# Patient Record
Sex: Female | Born: 1960 | Race: White | Hispanic: No | Marital: Single | Smoking: Current every day smoker
Health system: Southern US, Community
[De-identification: ages and names within clinical notes are randomized; demographics above are authoritative.]

## PROBLEM LIST (undated history)

## (undated) DIAGNOSIS — F319 Bipolar disorder, unspecified: Secondary | ICD-10-CM

## (undated) DIAGNOSIS — M549 Dorsalgia, unspecified: Secondary | ICD-10-CM

## (undated) DIAGNOSIS — I10 Essential (primary) hypertension: Secondary | ICD-10-CM

## (undated) DIAGNOSIS — G8929 Other chronic pain: Secondary | ICD-10-CM

## (undated) DIAGNOSIS — M419 Scoliosis, unspecified: Secondary | ICD-10-CM

## (undated) DIAGNOSIS — M1990 Unspecified osteoarthritis, unspecified site: Secondary | ICD-10-CM

## (undated) DIAGNOSIS — F32A Depression, unspecified: Secondary | ICD-10-CM

## (undated) DIAGNOSIS — R12 Heartburn: Secondary | ICD-10-CM

## (undated) DIAGNOSIS — F410 Panic disorder [episodic paroxysmal anxiety] without agoraphobia: Secondary | ICD-10-CM

## (undated) DIAGNOSIS — F419 Anxiety disorder, unspecified: Secondary | ICD-10-CM

## (undated) DIAGNOSIS — F329 Major depressive disorder, single episode, unspecified: Secondary | ICD-10-CM

## (undated) HISTORY — PX: APPENDECTOMY: SHX54

## (undated) HISTORY — PX: BACK SURGERY: SHX140

---

## 2007-03-01 ENCOUNTER — Inpatient Hospital Stay: Payer: Self-pay | Admitting: Unknown Physician Specialty

## 2009-04-21 ENCOUNTER — Inpatient Hospital Stay: Payer: Self-pay | Admitting: Psychiatry

## 2011-09-09 ENCOUNTER — Emergency Department (HOSPITAL_COMMUNITY)
Admission: EM | Admit: 2011-09-09 | Discharge: 2011-09-09 | Disposition: A | Discharge status: Home / Self Care | Payer: Medicaid Other | Attending: Emergency Medicine | Admitting: Emergency Medicine

## 2011-09-09 ENCOUNTER — Encounter (HOSPITAL_COMMUNITY): Payer: Self-pay

## 2011-09-09 DIAGNOSIS — M1990 Unspecified osteoarthritis, unspecified site: Secondary | ICD-10-CM | POA: Insufficient documentation

## 2011-09-09 DIAGNOSIS — M545 Low back pain, unspecified: Secondary | ICD-10-CM | POA: Insufficient documentation

## 2011-09-09 DIAGNOSIS — I10 Essential (primary) hypertension: Secondary | ICD-10-CM | POA: Insufficient documentation

## 2011-09-09 DIAGNOSIS — F17200 Nicotine dependence, unspecified, uncomplicated: Secondary | ICD-10-CM | POA: Insufficient documentation

## 2011-09-09 DIAGNOSIS — F319 Bipolar disorder, unspecified: Secondary | ICD-10-CM | POA: Insufficient documentation

## 2011-09-09 DIAGNOSIS — M4120 Other idiopathic scoliosis, site unspecified: Secondary | ICD-10-CM | POA: Insufficient documentation

## 2011-09-09 DIAGNOSIS — G8929 Other chronic pain: Secondary | ICD-10-CM

## 2011-09-09 HISTORY — DX: Scoliosis, unspecified: M41.9

## 2011-09-09 HISTORY — DX: Essential (primary) hypertension: I10

## 2011-09-09 HISTORY — DX: Anxiety disorder, unspecified: F41.9

## 2011-09-09 HISTORY — DX: Depression, unspecified: F32.A

## 2011-09-09 HISTORY — DX: Panic disorder (episodic paroxysmal anxiety): F41.0

## 2011-09-09 HISTORY — DX: Bipolar disorder, unspecified: F31.9

## 2011-09-09 HISTORY — DX: Unspecified osteoarthritis, unspecified site: M19.90

## 2011-09-09 HISTORY — DX: Heartburn: R12

## 2011-09-09 HISTORY — DX: Major depressive disorder, single episode, unspecified: F32.9

## 2011-09-09 MED ORDER — ONDANSETRON 4 MG PO TBDP
4.0000 mg | ORAL_TABLET | Freq: Once | ORAL | Status: AC
  Administered 2011-09-09: 4 mg via ORAL

## 2011-09-09 MED ORDER — ONDANSETRON 4 MG PO TBDP
ORAL_TABLET | ORAL | Status: AC
  Administered 2011-09-09: 4 mg via ORAL
  Filled 2011-09-09: qty 1

## 2011-09-09 MED ORDER — OXYCODONE-ACETAMINOPHEN 7.5-325 MG PO TABS: 1.0000 | ORAL_TABLET | ORAL | Status: AC | PRN

## 2011-09-09 MED ORDER — HYDROMORPHONE HCL 2 MG PO TABS: 4.0000 mg | ORAL_TABLET | Freq: Once | ORAL | Status: DC

## 2011-09-09 MED ORDER — HYDROMORPHONE HCL PF 1 MG/ML IJ SOLN
1.0000 mg | Freq: Once | INTRAMUSCULAR | Status: AC
  Administered 2011-09-09: 1 mg via INTRAMUSCULAR
  Filled 2011-09-09: qty 1
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
## 2011-09-09 NOTE — ED Provider Notes (Signed)
Medical screening examination/treatment/procedure(s) were performed by non-physician practitioner and as supervising physician I was immediately available for consultation/collaboration.   Renae Mottley L Kordell Jafri, MD 09/09/11 1515 
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
## 2011-09-09 NOTE — ED Notes (Signed)
Patient with no complaints at this time. Respirations even and unlabored. Skin warm/dry. Discharge instructions reviewed with patient at this time. Patient given opportunity to voice concerns/ask questions. Patient discharged at this time and left Emergency Department with steady gait.   
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
## 2011-09-26 ENCOUNTER — Encounter (HOSPITAL_COMMUNITY): Payer: Self-pay | Admitting: Emergency Medicine

## 2011-09-26 ENCOUNTER — Emergency Department (HOSPITAL_COMMUNITY)
Admission: EM | Admit: 2011-09-26 | Discharge: 2011-09-26 | Disposition: A | Discharge status: Home / Self Care | Payer: Medicaid Other | Attending: Emergency Medicine | Admitting: Emergency Medicine

## 2011-09-26 DIAGNOSIS — F319 Bipolar disorder, unspecified: Secondary | ICD-10-CM | POA: Insufficient documentation

## 2011-09-26 DIAGNOSIS — M4120 Other idiopathic scoliosis, site unspecified: Secondary | ICD-10-CM | POA: Insufficient documentation

## 2011-09-26 DIAGNOSIS — M1990 Unspecified osteoarthritis, unspecified site: Secondary | ICD-10-CM | POA: Insufficient documentation

## 2011-09-26 DIAGNOSIS — I10 Essential (primary) hypertension: Secondary | ICD-10-CM | POA: Insufficient documentation

## 2011-09-26 DIAGNOSIS — G8929 Other chronic pain: Secondary | ICD-10-CM

## 2011-09-26 DIAGNOSIS — Z760 Encounter for issue of repeat prescription: Secondary | ICD-10-CM | POA: Insufficient documentation

## 2011-09-26 DIAGNOSIS — F17200 Nicotine dependence, unspecified, uncomplicated: Secondary | ICD-10-CM | POA: Insufficient documentation

## 2011-09-26 NOTE — ED Notes (Signed)
Pt walked out after EDP eval cursing. Left prior to letting nurse know she was leaving. EDP aware.
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
## 2012-06-05 ENCOUNTER — Emergency Department (HOSPITAL_COMMUNITY)
Admission: EM | Admit: 2012-06-05 | Discharge: 2012-06-05 | Disposition: A | Discharge status: Home / Self Care | Payer: Medicaid Other | Attending: Emergency Medicine | Admitting: Emergency Medicine

## 2012-06-05 ENCOUNTER — Encounter (HOSPITAL_COMMUNITY): Payer: Self-pay

## 2012-06-05 DIAGNOSIS — Z9889 Other specified postprocedural states: Secondary | ICD-10-CM | POA: Insufficient documentation

## 2012-06-05 DIAGNOSIS — X503XXA Overexertion from repetitive movements, initial encounter: Secondary | ICD-10-CM | POA: Insufficient documentation

## 2012-06-05 DIAGNOSIS — IMO0002 Reserved for concepts with insufficient information to code with codable children: Secondary | ICD-10-CM | POA: Insufficient documentation

## 2012-06-05 DIAGNOSIS — Y9389 Activity, other specified: Secondary | ICD-10-CM | POA: Insufficient documentation

## 2012-06-05 DIAGNOSIS — I10 Essential (primary) hypertension: Secondary | ICD-10-CM | POA: Insufficient documentation

## 2012-06-05 DIAGNOSIS — Y9289 Other specified places as the place of occurrence of the external cause: Secondary | ICD-10-CM | POA: Insufficient documentation

## 2012-06-05 DIAGNOSIS — F319 Bipolar disorder, unspecified: Secondary | ICD-10-CM | POA: Insufficient documentation

## 2012-06-05 DIAGNOSIS — Z79899 Other long term (current) drug therapy: Secondary | ICD-10-CM | POA: Insufficient documentation

## 2012-06-05 DIAGNOSIS — F410 Panic disorder [episodic paroxysmal anxiety] without agoraphobia: Secondary | ICD-10-CM | POA: Insufficient documentation

## 2012-06-05 DIAGNOSIS — Z8719 Personal history of other diseases of the digestive system: Secondary | ICD-10-CM | POA: Insufficient documentation

## 2012-06-05 DIAGNOSIS — Z760 Encounter for issue of repeat prescription: Secondary | ICD-10-CM | POA: Insufficient documentation

## 2012-06-05 DIAGNOSIS — F17200 Nicotine dependence, unspecified, uncomplicated: Secondary | ICD-10-CM | POA: Insufficient documentation

## 2012-06-05 DIAGNOSIS — Z8739 Personal history of other diseases of the musculoskeletal system and connective tissue: Secondary | ICD-10-CM | POA: Insufficient documentation

## 2012-06-05 DIAGNOSIS — M549 Dorsalgia, unspecified: Secondary | ICD-10-CM

## 2012-06-05 MED ORDER — CYCLOBENZAPRINE HCL 10 MG PO TABS: 10.0000 mg | ORAL_TABLET | Freq: Three times a day (TID) | ORAL | Status: DC | PRN

## 2012-06-05 MED ORDER — OXYCODONE-ACETAMINOPHEN 5-325 MG PO TABS
1.0000 | ORAL_TABLET | Freq: Once | ORAL | Status: AC
  Administered 2012-06-05: 1 via ORAL
  Filled 2012-06-05: qty 1

## 2012-06-05 NOTE — ED Provider Notes (Signed)
History     CSN: [id number]  Arrival date & time 06/05/12  1342   First MD Initiated Contact with Patient 06/05/12 1428      Chief Complaint  Patient presents with  . Back Pain    (Consider location/radiation/quality/duration/timing/severity/associated sxs/prior treatment) Patient is a 52 y.o. female presenting with back pain. The history is provided by the patient.  Back Pain Location:  Lumbar spine Quality:  Aching Radiates to:  Does not radiate Pain severity:  Moderate Pain is:  Same all the time Onset quality:  Gradual Duration:  2 days Timing:  Constant Progression:  Worsening Chronicity:  Chronic Context: lifting heavy objects, recent injury and twisting   Context: not falling   Context comment:  Low back pain after doing yardwork Relieved by:  Nothing Worsened by:  Bending, movement, sitting, palpation, twisting and standing Ineffective treatments: tylenol pm. Associated symptoms: no abdominal pain, no abdominal swelling, no bladder incontinence, no bowel incontinence, no chest pain, no dysuria, no fever, no headaches, no leg pain, no numbness, no paresthesias, no pelvic pain, no perianal numbness, no tingling and no weakness     Past Medical History  Diagnosis Date  . DJD (degenerative joint disease)   . Hypertension   . Scoliosis   . Depression   . Heartburn   . Anxiety   . Panic attack   . Bipolar 1 disorder     Past Surgical History  Procedure Laterality Date  . Tonsillectomy    . Appendectomy    . Back surgery      No family history on file.  History  Substance Use Topics  . Smoking status: Current Every Day Smoker    Types: Cigarettes  . Smokeless tobacco: Not on file  . Alcohol Use: No    OB History   Grav Para Term Preterm Abortions TAB SAB Ect Mult Living                  Review of Systems  Constitutional: Negative for fever.  Respiratory: Negative for shortness of breath.   Cardiovascular: Negative for chest pain.    Gastrointestinal: Negative for vomiting, abdominal pain, constipation and bowel incontinence.  Genitourinary: Negative for bladder incontinence, dysuria, hematuria, flank pain, decreased urine volume, difficulty urinating and pelvic pain.       No perineal numbness or incontinence of urine or feces  Musculoskeletal: Positive for back pain. Negative for joint swelling.  Skin: Negative for rash.  Neurological: Negative for tingling, weakness, numbness, headaches and paresthesias.  All other systems reviewed and are negative.    Allergies  Review of patient's allergies indicates no known allergies.  Home Medications   Current Outpatient Rx  Name  Route  Sig  Dispense  Refill  . celecoxib (CELEBREX) 200 MG capsule   Oral   Take 200 mg by mouth 2 (two) times daily.         . cloNIDine (CATAPRES) 0.1 MG tablet   Oral   Take 0.1 mg by mouth 2 (two) times daily.         . diphenhydramine-acetaminophen (TYLENOL PM) 25-500 MG TABS   Oral   Take 1 tablet by mouth at bedtime as needed (sleep).         Marland Kitchen FLUoxetine (PROZAC) 20 MG capsule   Oral   Take 20 mg by mouth daily.         Marland Kitchen lisinopril (PRINIVIL,ZESTRIL) 20 MG tablet   Oral   Take 20 mg by mouth daily.         Marland Kitchen  omeprazole (PRILOSEC) 20 MG capsule   Oral   Take 20 mg by mouth daily.         Marland Kitchen oxyCODONE-acetaminophen (PERCOCET) 7.5-325 MG per tablet   Oral   Take 1 tablet by mouth 4 (four) times daily. Pain         . venlafaxine XR (EFFEXOR-XR) 150 MG 24 hr capsule   Oral   Take 150 mg by mouth daily.           BP 153/88  Pulse 99  Temp(Src) 98.5 F (36.9 C) (Oral)  Resp 20  Ht 5\' 4"  (1.626 m)  Wt 235 lb (106.595 kg)  BMI 40.32 kg/m2  SpO2 100%  Physical Exam  Nursing note and vitals reviewed. Constitutional: She is oriented to person, place, and time. She appears well-developed and well-nourished. No distress.  HENT:  Head: Normocephalic and atraumatic.  Neck: Normal range of motion. Neck  supple.  Cardiovascular: Normal rate, regular rhythm, normal heart sounds and intact distal pulses.   No murmur heard. Pulmonary/Chest: Effort normal and breath sounds normal.  Musculoskeletal: She exhibits tenderness. She exhibits no edema.       Lumbar back: She exhibits tenderness and pain. She exhibits normal range of motion, no swelling, no deformity, no laceration and normal pulse.       Back:  ttp of the lumbar paraspinal muscles.  DP pulse brisk, distal sensation intact.  No spinal tenderness  Neurological: She is alert and oriented to person, place, and time. No cranial nerve deficit or sensory deficit. She exhibits normal muscle tone. Coordination and gait normal.  Reflex Scores:      Patellar reflexes are 2+ on the right side and 2+ on the left side.      Achilles reflexes are 2+ on the right side and 2+ on the left side. Skin: Skin is warm and dry.    ED Course  Procedures (including critical care time)  Labs Reviewed - No data to display No results found.   1. Back pain   2. Medication refill       MDM    Patient has ttp of the lumbar paraspinal muscles.  No focal neuro deficits on exam.  Ambulates with a steady gait.   Doubt emergent neurological or infectious process  Reviewed in the [place] narcotics database, was prescribed #120 oxycocdone 7.5 mg on 05/16/12 by her physician in [place], [place], Dr. Annie Sable  Patient was advised that no narcotics will be prescribed.  She agrees to f/u with her pain management     Orvile Corona L. Trisha Mangle, PA-C 06/07/12 1903

## 2012-06-05 NOTE — ED Notes (Signed)
Pt reports back pain for 2 days -injured while working in the yard, has h/o chronic back pain, has taken tylenol pm

## 2012-06-08 NOTE — ED Provider Notes (Signed)
Medical screening examination/treatment/procedure(s) were performed by non-physician practitioner and as supervising physician I was immediately available for consultation/collaboration.  Donnetta Hutching, MD 06/08/12 1220

## 2012-07-14 ENCOUNTER — Encounter (HOSPITAL_COMMUNITY): Payer: Self-pay | Admitting: Emergency Medicine

## 2012-07-14 ENCOUNTER — Emergency Department (HOSPITAL_COMMUNITY)
Admission: EM | Admit: 2012-07-14 | Discharge: 2012-07-14 | Disposition: A | Discharge status: Home / Self Care | Payer: Medicaid Other | Attending: Emergency Medicine | Admitting: Emergency Medicine

## 2012-07-14 DIAGNOSIS — M549 Dorsalgia, unspecified: Secondary | ICD-10-CM

## 2012-07-14 DIAGNOSIS — F17200 Nicotine dependence, unspecified, uncomplicated: Secondary | ICD-10-CM | POA: Insufficient documentation

## 2012-07-14 DIAGNOSIS — Z8739 Personal history of other diseases of the musculoskeletal system and connective tissue: Secondary | ICD-10-CM | POA: Insufficient documentation

## 2012-07-14 DIAGNOSIS — I10 Essential (primary) hypertension: Secondary | ICD-10-CM | POA: Insufficient documentation

## 2012-07-14 DIAGNOSIS — M545 Low back pain, unspecified: Secondary | ICD-10-CM | POA: Insufficient documentation

## 2012-07-14 DIAGNOSIS — F410 Panic disorder [episodic paroxysmal anxiety] without agoraphobia: Secondary | ICD-10-CM | POA: Insufficient documentation

## 2012-07-14 DIAGNOSIS — F319 Bipolar disorder, unspecified: Secondary | ICD-10-CM | POA: Insufficient documentation

## 2012-07-14 DIAGNOSIS — Z79899 Other long term (current) drug therapy: Secondary | ICD-10-CM | POA: Insufficient documentation

## 2012-07-14 DIAGNOSIS — Z9889 Other specified postprocedural states: Secondary | ICD-10-CM | POA: Insufficient documentation

## 2012-07-14 DIAGNOSIS — G8929 Other chronic pain: Secondary | ICD-10-CM | POA: Insufficient documentation

## 2012-07-14 MED ORDER — CYCLOBENZAPRINE HCL 10 MG PO TABS
10.0000 mg | ORAL_TABLET | Freq: Once | ORAL | Status: AC
  Administered 2012-07-14: 10 mg via ORAL
  Filled 2012-07-14: qty 1

## 2012-07-14 MED ORDER — HYDROMORPHONE HCL PF 1 MG/ML IJ SOLN
1.0000 mg | Freq: Once | INTRAMUSCULAR | Status: AC
  Administered 2012-07-14: 1 mg via INTRAMUSCULAR
  Filled 2012-07-14: qty 1

## 2012-07-14 MED ORDER — ONDANSETRON 4 MG PO TBDP
4.0000 mg | ORAL_TABLET | Freq: Once | ORAL | Status: AC
  Administered 2012-07-14: 4 mg via ORAL
  Filled 2012-07-14: qty 1

## 2012-07-14 NOTE — ED Notes (Signed)
Discharge instructions given and reviewed with patient.  Patient verbalized understanding to keep her appointment this week with her doctor and to contact her MD for additional refills on her pain medication.  Patient ambulatory; refused wheelchair.  Patient to call family to pick her up; discharged home in good condition.

## 2012-07-14 NOTE — ED Provider Notes (Signed)
History     CSN: [id number]  Arrival date & time 07/14/12  0309   First MD Initiated Contact with Patient 07/14/12 [phone]      Chief Complaint  Patient presents with  . Back Pain    (Consider location/radiation/quality/duration/timing/severity/associated sxs/prior treatment) HPI HPI Comments: Kara Hart is a 52 y.o. female with a h/o back surgery and back pain on chronic narcotics brought in by ambulance, who presents to the Emergency Department complaining of lower back pain that radiates down both legs that began today after doing yard work.She has a h/o DJD and scoliosis. Denies numbness and tingling, difficulty urinating, fever, chills.   Back surgeon Dr. Marca Ancona   Past Medical History  Diagnosis Date  . DJD (degenerative joint disease)   . Hypertension   . Scoliosis   . Depression   . Heartburn   . Anxiety   . Panic attack   . Bipolar 1 disorder     Past Surgical History  Procedure Laterality Date  . Appendectomy    . Back surgery      No family history on file.  History  Substance Use Topics  . Smoking status: Current Every Day Smoker    Types: Cigarettes  . Smokeless tobacco: Not on file  . Alcohol Use: No    OB History   Grav Para Term Preterm Abortions TAB SAB Ect Mult Living                  Review of Systems  Constitutional: Negative for fever.       10 Systems reviewed and are negative for acute change except as noted in the HPI.  HENT: Negative for congestion.   Eyes: Negative for discharge and redness.  Respiratory: Negative for cough and shortness of breath.   Cardiovascular: Negative for chest pain.  Gastrointestinal: Negative for vomiting and abdominal pain.  Musculoskeletal: Positive for back pain.  Skin: Negative for rash.  Neurological: Negative for syncope, numbness and headaches.  Psychiatric/Behavioral:       No behavior change.    Allergies  Review of patient's allergies indicates no known allergies.  Home  Medications   Current Outpatient Rx  Name  Route  Sig  Dispense  Refill  . celecoxib (CELEBREX) 200 MG capsule   Oral   Take 200 mg by mouth 2 (two) times daily.         . cloNIDine (CATAPRES) 0.1 MG tablet   Oral   Take 0.1 mg by mouth 2 (two) times daily.         Marland Kitchen FLUoxetine (PROZAC) 20 MG capsule   Oral   Take 20 mg by mouth daily.         Marland Kitchen omeprazole (PRILOSEC) 20 MG capsule   Oral   Take 20 mg by mouth daily.         Marland Kitchen oxyCODONE-acetaminophen (PERCOCET) 7.5-325 MG per tablet   Oral   Take 1 tablet by mouth 4 (four) times daily. Pain         . venlafaxine XR (EFFEXOR-XR) 150 MG 24 hr capsule   Oral   Take 150 mg by mouth daily.         . cyclobenzaprine (FLEXERIL) 10 MG tablet   Oral   Take 1 tablet (10 mg total) by mouth 3 (three) times daily as needed.   30 tablet   0   . diphenhydramine-acetaminophen (TYLENOL PM) 25-500 MG TABS   Oral   Take 1 tablet by mouth at  bedtime as needed (sleep).         Marland Kitchen lisinopril (PRINIVIL,ZESTRIL) 20 MG tablet   Oral   Take 20 mg by mouth daily.           BP 123/71  Pulse 121  Temp(Src) 98.3 F (36.8 C) (Oral)  Resp 24  Ht 5\' 5"  (1.651 m)  Wt 230 lb (104.327 kg)  BMI 38.27 kg/m2  SpO2 96%  Physical Exam  Nursing note and vitals reviewed. Constitutional: She appears well-developed and well-nourished.  Awake, alert, nontoxic appearance.  HENT:  Head: Normocephalic and atraumatic.  Eyes: EOM are normal. Pupils are equal, round, and reactive to light.  Neck: Normal range of motion. Neck supple.  Cardiovascular: Normal rate and intact distal pulses.   Pulmonary/Chest: Effort normal and breath sounds normal. She exhibits no tenderness.  Abdominal: Soft. Bowel sounds are normal. There is no tenderness. There is no rebound.  Musculoskeletal: She exhibits no tenderness.  Baseline ROM, no obvious new focal weakness. Pain with palpation of the left side of her lower back. Well healed midline scar present.    Neurological:  Mental status and motor strength appears baseline for patient and situation.  Skin: No rash noted.  Psychiatric: She has a normal mood and affect.    ED Course  Procedures (including critical care time) Medications  HYDROmorphone (DILAUDID) injection 1 mg (not administered)  ondansetron (ZOFRAN-ODT) disintegrating tablet 4 mg (not administered)  cyclobenzaprine (FLEXERIL) tablet 10 mg (not administered)     MDM  Patient with increased back pain after doing yard work. She is on narcotic analgesics for chronic back pain. Given dilaudid, zofran and flexeril. She will follow up on Thursday with her back doctor.  Pt stable in ED with no significant deterioration in condition.The patient appears reasonably screened and/or stabilized for discharge and I doubt any other medical condition or other [facility] requiring further screening, evaluation, or treatment in the ED at this time prior to discharge.  MDM Reviewed: nursing note and vitals           Nicoletta Dress. Colon Branch, MD 07/14/12 [phone]

## 2012-07-14 NOTE — ED Notes (Signed)
Patient presents to ER via [facility] with c/o back pain.  States she has DJD and scoliosis and mowed the grass yesterday and now is having lower back pain that radiates down her legs.

## 2016-10-09 ENCOUNTER — Encounter (HOSPITAL_COMMUNITY): Payer: Self-pay | Admitting: Emergency Medicine

## 2016-10-09 ENCOUNTER — Emergency Department (HOSPITAL_COMMUNITY)
Admission: EM | Admit: 2016-10-09 | Discharge: 2016-10-09 | Disposition: A | Discharge status: Home / Self Care | Payer: Medicaid Other | Attending: Emergency Medicine | Admitting: Emergency Medicine

## 2016-10-09 DIAGNOSIS — F17210 Nicotine dependence, cigarettes, uncomplicated: Secondary | ICD-10-CM | POA: Diagnosis not present

## 2016-10-09 DIAGNOSIS — Z79899 Other long term (current) drug therapy: Secondary | ICD-10-CM | POA: Insufficient documentation

## 2016-10-09 DIAGNOSIS — G8929 Other chronic pain: Secondary | ICD-10-CM

## 2016-10-09 DIAGNOSIS — M545 Low back pain: Secondary | ICD-10-CM | POA: Diagnosis present

## 2016-10-09 DIAGNOSIS — M5440 Lumbago with sciatica, unspecified side: Secondary | ICD-10-CM | POA: Diagnosis not present

## 2016-10-09 MED ORDER — CYCLOBENZAPRINE HCL 10 MG PO TABS: 10.0000 mg | ORAL_TABLET | Freq: Three times a day (TID) | ORAL | 0 refills | Status: DC

## 2016-10-09 MED ORDER — MORPHINE SULFATE (PF) 10 MG/ML IV SOLN
10.0000 mg | Freq: Once | INTRAVENOUS | Status: AC
  Administered 2016-10-09: 10 mg via INTRAMUSCULAR
  Filled 2016-10-09: qty 1

## 2016-10-09 MED ORDER — ONDANSETRON HCL 4 MG PO TABS
4.0000 mg | ORAL_TABLET | Freq: Once | ORAL | Status: AC
  Administered 2016-10-09: 4 mg via ORAL
  Filled 2016-10-09: qty 1

## 2016-10-09 MED ORDER — CYCLOBENZAPRINE HCL 10 MG PO TABS
10.0000 mg | ORAL_TABLET | Freq: Once | ORAL | Status: AC
  Administered 2016-10-09: 10 mg via ORAL
  Filled 2016-10-09: qty 1

## 2016-10-09 MED ORDER — CELECOXIB 200 MG PO CAPS: 200.0000 mg | ORAL_CAPSULE | Freq: Two times a day (BID) | ORAL | 0 refills | Status: DC

## 2016-10-09 NOTE — ED Triage Notes (Signed)
Pt c/o chronic lower back pain and states she just moved up here the last couple of days. Pt states she is out of some of her medications.

## 2016-10-09 NOTE — ED Notes (Signed)
Pt asking to speak with PA. PA notified.

## 2016-10-09 NOTE — ED Provider Notes (Signed)
[facility] Provider Note   CSN: [id number] Arrival date & time: 10/09/16  2034     History   Chief Complaint Chief Complaint  Patient presents with  . Back Pain    HPI Kara Hart is a 56 y.o. female.   Patient is a 56 year old female who presents to the emergency department with a complaint of lower back pain.  The patient has a history of degenerative joint disease, and degenerative disc disease. She has a history of scoliosis. She has required back surgery on more than one occasion. The patient states that she is moving to this area. In the course of moving she was walking up and down steps, and also helping to carry objects in and out withoutover the weekend. She now feels as though there is a knife or something sharp sticking in her back and it will not allow her to move. She states that she can rub her hand over her lower back and feel knots on. She's not had any loss of control of bowels or bladder. She's not had any problems with sensation in the saddle area. She did not have any actual fall during the weekend. The patient states that she is out of some of her medications.She has an appointment in about 10 days with a new physician, but did not feel she could manage the pain until that time. She presents now for evaluation, an assistance with her pain.      Past Medical History:  Diagnosis Date  . Anxiety   . Bipolar 1 disorder (HCC)   . Depression   . DJD (degenerative joint disease)   . Heartburn   . Hypertension   . Panic attack   . Scoliosis     There are no active problems to display for this patient.   Past Surgical History:  Procedure Laterality Date  . APPENDECTOMY    . BACK SURGERY      OB History    No data available       Home Medications    Prior to Admission medications   Medication Sig Start Date End Date Taking? Authorizing Provider  celecoxib (CELEBREX) 200 MG capsule Take 200 mg by mouth 2 (two) times daily.    [provider]  cloNIDine (CATAPRES) 0.1 MG tablet Take 0.1 mg by mouth 2 (two) times daily.    [provider]  cyclobenzaprine (FLEXERIL) 10 MG tablet Take 1 tablet (10 mg total) by mouth 3 (three) times daily as needed. 06/05/12   Triplett, Tammy, PA-C  diphenhydramine-acetaminophen (TYLENOL PM) 25-500 MG TABS Take 1 tablet by mouth at bedtime as needed (sleep).    [provider]  FLUoxetine (PROZAC) 20 MG capsule Take 20 mg by mouth daily.    [provider]  lisinopril (PRINIVIL,ZESTRIL) 20 MG tablet Take 20 mg by mouth daily.    [provider]  omeprazole (PRILOSEC) 20 MG capsule Take 20 mg by mouth daily.    [provider]  oxyCODONE-acetaminophen (PERCOCET) 7.5-325 MG per tablet Take 1 tablet by mouth 4 (four) times daily. Pain    [provider]  venlafaxine XR (EFFEXOR-XR) 150 MG 24 hr capsule Take 150 mg by mouth daily.    [provider]    Family History History reviewed. No pertinent family history.  Social History Social History  Substance Use Topics  . Smoking status: Current Every Day Smoker    Types: Cigarettes  . Smokeless tobacco: Never Used  . Alcohol use No  Allergies   Patient has no known allergies.   Review of Systems Review of Systems  Constitutional: Negative for activity change.       All ROS Neg except as noted in HPI  HENT: Negative for nosebleeds.   Eyes: Negative for photophobia and discharge.  Respiratory: Negative for cough, shortness of breath and wheezing.   Cardiovascular: Negative for chest pain and palpitations.  Gastrointestinal: Negative for abdominal pain and blood in stool.  Genitourinary: Negative for dysuria, frequency and hematuria.  Musculoskeletal: Positive for arthralgias and back pain. Negative for neck pain.  Skin: Negative.   Neurological: Negative for dizziness, seizures and speech difficulty.  Psychiatric/Behavioral: Negative for confusion and  hallucinations. The patient is nervous/anxious.      Physical Exam Updated Vital Signs BP (!) 147/78   Pulse 85   Temp 98.6 F (37 C) (Oral)   Resp 17   Ht 5\' 4"  (1.626 m)   Wt 85.3 kg (188 lb)   SpO2 97%   BMI 32.27 kg/m   Physical Exam  Constitutional: She is oriented to person, place, and time. She appears well-developed and well-nourished.  Non-toxic appearance.  HENT:  Head: Normocephalic.  Right Ear: Tympanic membrane and external ear normal.  Left Ear: Tympanic membrane and external ear normal.  Eyes: Pupils are equal, round, and reactive to light. EOM and lids are normal.  Neck: Normal range of motion. Neck supple. Carotid bruit is not present.  Cardiovascular: Normal rate, regular rhythm, normal heart sounds, intact distal pulses and normal pulses.   Pulmonary/Chest: Breath sounds normal. No respiratory distress.  Abdominal: Soft. Bowel sounds are normal. There is no tenderness. There is no guarding.  Musculoskeletal: She exhibits tenderness.  There is pain to palpation of the lumbar spine area as well as the paraspinal muscle areas. There is some active spasm present. There is no palpable step off of the thoracic or the lumbar spine. There no hot areas appreciated. There is pain with attempted leg raise on the left.  Lymphadenopathy:       Head (right side): No submandibular adenopathy present.       Head (left side): No submandibular adenopathy present.    She has no cervical adenopathy.  Neurological: She is alert and oriented to person, place, and time. She has normal strength. No cranial nerve deficit. Coordination normal.  The patient states that she can feel on touch and pain on the left side, but she says it feels as though her leg is asleep. This is new. No sensory deficit appreciated on the right. Patient is ambulatory, but very slow. No foot drop appreciated.  Skin: Skin is warm and dry.  Psychiatric: She has a normal mood and affect. Her speech is normal.    Nursing note and vitals reviewed.    ED Treatments / Results  Labs (all labs ordered are listed, but only abnormal results are displayed) Labs Reviewed - No data to display  EKG  EKG Interpretation None       Radiology No results found.  Procedures Procedures (including critical care time)  Medications Ordered in ED Medications  Morphine Sulfate (PF) SOLN 10 mg (not administered)  cyclobenzaprine (FLEXERIL) tablet 10 mg (not administered)  ondansetron (ZOFRAN) tablet 4 mg (not administered)     Initial Impression / Assessment and Plan / ED Course  I have reviewed the triage vital signs and the nursing notes.  Pertinent labs & imaging results that were available during my care of the patient were reviewed  by me and considered in my medical decision making (see chart for details).      Final Clinical Impressions(s) / ED Diagnoses MDM  narcotic pain medication and muscle relaxer.  Patient improved some.  Prescription for Celebrex and Flexeril given to the patient. Patient is scheduled to see a new physician in about 10 days.  11:05pm Called back to the patient's room at discharge. The patient is upset that she did not receive a narcotic pain medication. She states that she has taken the Celebrex and Flexeril in the past, and they do very little for her. 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 The patient requested various doses of Percocet. I explained to the patient the state in hospital guidelines for the use of narcotics in chronic medical conditions. The patient became upset with me and said that this was a "total waste of my time". Patient then left the room.    Final diagnoses:  Chronic low back pain with sciatica, sciatica laterality unspecified, unspecified back pain laterality    New Prescriptions New Prescriptions   CELECOXIB (CELEBREX) 200 MG CAPSULE    Take 1 capsule (200 mg total) by mouth 2 (two)  times daily.   CYCLOBENZAPRINE (FLEXERIL) 10 MG TABLET    Take 1 tablet (10 mg total) by mouth 3 (three) times daily.     Ivery Quale, PA-C 10/09/16 2208    Ivery Quale, PA-C 10/09/16 2312    Donnetta Hutching, MD 10/10/16 [phone]

## 2016-10-09 NOTE — ED Notes (Signed)
Pt ambulatory to waiting room. Pt verbalized understanding of discharge instructions.   

## 2016-10-09 NOTE — Discharge Instructions (Signed)
Please rest your back is much as possible. Refrain from lifting, pushing, straining. Please use a heating pad to your back when resting. Use Celebrex 2 times daily with food. Use Flexeril 3 times daily for the knots and muscle spasm pain in your lower back. Flexeril may cause drowsiness, please use this medication with caution. Please take these medications with you to your new physician so that they may be, part of your medical record there.

## 2016-10-22 ENCOUNTER — Encounter (HOSPITAL_COMMUNITY): Payer: Self-pay | Admitting: Emergency Medicine

## 2016-10-22 ENCOUNTER — Emergency Department (HOSPITAL_COMMUNITY)
Admission: EM | Admit: 2016-10-22 | Discharge: 2016-10-22 | Disposition: A | Discharge status: Home / Self Care | Payer: Medicaid Other | Attending: Emergency Medicine | Admitting: Emergency Medicine

## 2016-10-22 DIAGNOSIS — M545 Low back pain, unspecified: Secondary | ICD-10-CM

## 2016-10-22 DIAGNOSIS — G8929 Other chronic pain: Secondary | ICD-10-CM

## 2016-10-22 DIAGNOSIS — F17210 Nicotine dependence, cigarettes, uncomplicated: Secondary | ICD-10-CM | POA: Insufficient documentation

## 2016-10-22 DIAGNOSIS — M1990 Unspecified osteoarthritis, unspecified site: Secondary | ICD-10-CM | POA: Insufficient documentation

## 2016-10-22 DIAGNOSIS — I10 Essential (primary) hypertension: Secondary | ICD-10-CM | POA: Diagnosis not present

## 2016-10-22 DIAGNOSIS — Z79899 Other long term (current) drug therapy: Secondary | ICD-10-CM | POA: Diagnosis not present

## 2016-10-22 MED ORDER — METHOCARBAMOL 500 MG PO TABS: 1000.0000 mg | ORAL_TABLET | Freq: Four times a day (QID) | ORAL | 0 refills | Status: AC

## 2016-10-22 MED ORDER — ONDANSETRON 4 MG PO TBDP
4.0000 mg | ORAL_TABLET | Freq: Once | ORAL | Status: AC
  Administered 2016-10-22: 4 mg via ORAL
  Filled 2016-10-22: qty 1

## 2016-10-22 MED ORDER — HYDROMORPHONE HCL 1 MG/ML IJ SOLN
1.0000 mg | Freq: Once | INTRAMUSCULAR | Status: AC
  Administered 2016-10-22: 1 mg via INTRAMUSCULAR
  Filled 2016-10-22: qty 1

## 2016-10-22 MED ORDER — LIDOCAINE 5 % EX PTCH: 1.0000 | MEDICATED_PATCH | CUTANEOUS | 0 refills | Status: DC

## 2016-10-22 NOTE — ED Provider Notes (Signed)
[facility] Provider Note   CSN: [id number] Arrival date & time: 10/22/16  1520     History   Chief Complaint Chief Complaint  Patient presents with  . Back Pain    HPI Kara Hart is a 56 y.o. female with a history as outlined below significant fo DJD, chronic low back pain (s/p lumbar diskectomy and scoliosis) presenting with acute on chronic low back pain.  [place] near grandchildren and has struggled to obtain new medical providers.  She ran out of her percocet about one month ago and has been in severe pain since.  She is scheduled to establish care with Dr. Felecia Shelling next week. There is no radiation of pain into her legs and there has been no weakness or numbness in the lower extremities and no urinary or bowel retention or incontinence.  Patient does not have a history of cancer or IVDU.  She has found no alleviators for her chronic pain.  The history is provided by the patient.    Past Medical History:  Diagnosis Date  . Anxiety   . Bipolar 1 disorder (HCC)   . Depression   . DJD (degenerative joint disease)   . Heartburn   . Hypertension   . Panic attack   . Scoliosis     There are no active problems to display for this patient.   Past Surgical History:  Procedure Laterality Date  . APPENDECTOMY    . BACK SURGERY      OB History    No data available       Home Medications    Prior to Admission medications   Medication Sig Start Date End Date Taking? Authorizing Provider  celecoxib (CELEBREX) 200 MG capsule Take 1 capsule (200 mg total) by mouth 2 (two) times daily. 10/09/16  Yes Ivery Quale, PA-C  cloNIDine (CATAPRES) 0.1 MG tablet Take 0.1 mg by mouth 2 (two) times daily.   Yes [provider]  cyclobenzaprine (FLEXERIL) 10 MG tablet Take 1 tablet (10 mg total) by mouth 3 (three) times daily. 10/09/16  Yes Ivery Quale, PA-C  diphenhydramine-acetaminophen  (TYLENOL PM) 25-500 MG TABS Take 1 tablet by mouth at bedtime as needed (sleep).   Yes [provider]  FLUoxetine (PROZAC) 20 MG capsule Take 20 mg by mouth daily.   Yes [provider]  omeprazole (PRILOSEC) 20 MG capsule Take 20 mg by mouth daily.   Yes [provider]  oxyCODONE-acetaminophen (PERCOCET) 7.5-325 MG per tablet Take 1 tablet by mouth 4 (four) times daily. Pain   Yes [provider]  venlafaxine XR (EFFEXOR-XR) 150 MG 24 hr capsule Take 150 mg by mouth daily.   Yes [provider]  lidocaine (LIDODERM) 5 % Place 1 patch onto the skin daily. Remove & Discard patch within 12 hours or as directed by MD 10/22/16   Burgess Amor, PA-C  methocarbamol (ROBAXIN) 500 MG tablet Take 2 tablets (1,000 mg total) by mouth 4 (four) times daily. 10/22/16 11/01/16  Burgess Amor, PA-C    Family History No family history on file.  Social History Social History  Substance Use Topics  . Smoking status: Current Every Day Smoker    Types: Cigarettes  . Smokeless tobacco: Never Used  . Alcohol use No     Allergies   Patient has no known allergies.   Review of Systems Review of Systems  Constitutional: Negative for fever.  Respiratory:  Negative for shortness of breath.   Cardiovascular: Negative for chest pain and leg swelling.  Gastrointestinal: Negative for abdominal distention, abdominal pain and constipation.  Genitourinary: Negative for difficulty urinating, dysuria, flank pain, frequency and urgency.  Musculoskeletal: Positive for back pain. Negative for gait problem and joint swelling.  Skin: Negative for rash.  Neurological: Negative for weakness and numbness.     Physical Exam Updated Vital Signs BP 132/72 (BP Location: Right Arm)   Pulse (!) 107   Temp 98.6 F (37 C) (Oral)   Resp 18   Ht 5\' 4"  (1.626 m)   Wt 83.9 kg (185 lb)   SpO2 97%   BMI 31.76 kg/m   Physical Exam  Constitutional: She appears well-developed and  well-nourished.  HENT:  Head: Normocephalic.  Eyes: Conjunctivae are normal.  Neck: Normal range of motion. Neck supple.  Cardiovascular: Normal rate and intact distal pulses.   Pedal pulses normal.  Pulmonary/Chest: Effort normal.  Abdominal: Soft. Bowel sounds are normal. She exhibits no distension and no mass.  Musculoskeletal: Normal range of motion. She exhibits no edema.       Lumbar back: She exhibits tenderness. She exhibits no swelling, no edema and no spasm.  Neurological: She is alert. She has normal strength. She displays abnormal reflex. She displays no atrophy and no tremor. No sensory deficit. Gait normal.  Reflex Scores:      Patellar reflexes are 1+ on the right side and 1+ on the left side. No strength deficit noted in hip and knee flexor and extensor muscle groups.  Ankle flexion and extension intact. Equal but reduced bilateral patellar dtr's.  Skin: Skin is warm and dry.  Psychiatric: She has a normal mood and affect.  Nursing note and vitals reviewed.    ED Treatments / Results  Labs (all labs ordered are listed, but only abnormal results are displayed) Labs Reviewed - No data to display  EKG  EKG Interpretation None       Radiology No results found.  Procedures Procedures (including critical care time)  Medications Ordered in ED Medications  ondansetron (ZOFRAN-ODT) disintegrating tablet 4 mg (not administered)  HYDROmorphone (DILAUDID) injection 1 mg (not administered)     Initial Impression / Assessment and Plan / ED Course  I have reviewed the triage vital signs and the nursing notes.  Pertinent labs & imaging results that were available during my care of the patient were reviewed by me and considered in my medical decision making (see chart for details).     Pt given dilaudid 1 mg IM to help reduce her current pain level.  Explained that percocet will not be prescribed for her chronic pain.  Will give a trial of robaxin and lidoderm  patch.  Plan f/u with new pcp as arranged.  Final Clinical Impressions(s) / ED Diagnoses   Final diagnoses:  Chronic midline low back pain without sciatica    New Prescriptions New Prescriptions   LIDOCAINE (LIDODERM) 5 %    Place 1 patch onto the skin daily. Remove & Discard patch within 12 hours or as directed by MD   METHOCARBAMOL (ROBAXIN) 500 MG TABLET    Take 2 tablets (1,000 mg total) by mouth 4 (four) times daily.     Burgess Amor, PA-C 10/22/16 1658    Eber Hong, MD 10/31/16 [phone]

## 2016-10-22 NOTE — ED Triage Notes (Signed)
Patient comes in from home via EMS. Patient complains of lower back pain that radiates down to left knee.

## 2016-11-07 ENCOUNTER — Emergency Department (HOSPITAL_COMMUNITY)
Admission: EM | Admit: 2016-11-07 | Discharge: 2016-11-07 | Disposition: A | Discharge status: Home / Self Care | Payer: Medicaid Other | Attending: Emergency Medicine | Admitting: Emergency Medicine

## 2016-11-07 ENCOUNTER — Encounter (HOSPITAL_COMMUNITY): Payer: Self-pay | Admitting: Emergency Medicine

## 2016-11-07 DIAGNOSIS — F17210 Nicotine dependence, cigarettes, uncomplicated: Secondary | ICD-10-CM | POA: Diagnosis not present

## 2016-11-07 DIAGNOSIS — I10 Essential (primary) hypertension: Secondary | ICD-10-CM | POA: Diagnosis not present

## 2016-11-07 DIAGNOSIS — Z79899 Other long term (current) drug therapy: Secondary | ICD-10-CM | POA: Diagnosis not present

## 2016-11-07 DIAGNOSIS — G8929 Other chronic pain: Secondary | ICD-10-CM | POA: Insufficient documentation

## 2016-11-07 DIAGNOSIS — M25552 Pain in left hip: Secondary | ICD-10-CM | POA: Diagnosis present

## 2016-11-07 DIAGNOSIS — M5440 Lumbago with sciatica, unspecified side: Secondary | ICD-10-CM | POA: Diagnosis not present

## 2016-11-07 MED ORDER — ETODOLAC 300 MG PO CAPS: 300.0000 mg | ORAL_CAPSULE | Freq: Three times a day (TID) | ORAL | 0 refills | Status: DC

## 2016-11-07 MED ORDER — HYDROMORPHONE HCL 1 MG/ML IJ SOLN
1.0000 mg | Freq: Once | INTRAMUSCULAR | Status: AC
  Administered 2016-11-07: 1 mg via INTRAMUSCULAR
  Filled 2016-11-07: qty 1

## 2016-11-07 NOTE — ED Notes (Signed)
Pt states she has chronic lower back pain and nerve damage that she is being followed by an ortho for. Today was walking and states her nerve damage made her fall. Refused x-rays while waiting stating "they usually have to do an MRI. Before I do anything I need pain medicine." Made pt aware that EDP will put orders in after seeing pt.

## 2016-11-07 NOTE — ED Notes (Addendum)
Pt reports "you will not be able to see anything on xray due to my arthritis and reports it will be a waste of time."

## 2016-11-07 NOTE — Discharge Instructions (Signed)
Follow up with a primary care doctor or pain management doctor

## 2016-11-07 NOTE — ED Provider Notes (Signed)
[facility] Provider Note   CSN: [id number] Arrival date & time: 11/07/16  1801     History   Chief Complaint Chief Complaint  Patient presents with  . Hip Pain    HPI Kara Hart is a 56 y.o. female.  HPI Patient presents to the emergency room for evaluation of lower back and hip pain. Patient states she has a history of chronic back and hip pain. She's had surgeries in the past. Patient states she had been taking chronic opiate pain medications when [place] was living in [place].She is supposed to have another surgery but she was trying to hold off. Patient states whenever she does certain activities occasionally she will have sharp pain in her lower back and hip. Patient states she had an episode of this this morning. She experienced acute pain in the lower back and hip. She fell but she does not think she injured herself.She continues to have pain. Patient denies any acute numbness or weakness. No fevers or chills.  Patient has been trying to establish care with a primary care doctor and a spine doctor.  She is concerned that she does not have any pain medications until then. Past Medical History:  Diagnosis Date  . Anxiety   . Bipolar 1 disorder (HCC)   . Depression   . DJD (degenerative joint disease)   . Heartburn   . Hypertension   . Panic attack   . Scoliosis     There are no active problems to display for this patient.   Past Surgical History:  Procedure Laterality Date  . APPENDECTOMY    . BACK SURGERY      OB History    No data available       Home Medications    Prior to Admission medications   Medication Sig Start Date End Date Taking? Authorizing Provider  cloNIDine (CATAPRES) 0.1 MG tablet Take 0.1 mg by mouth 2 (two) times daily.    [provider]  cyclobenzaprine (FLEXERIL) 10 MG tablet Take 1 tablet (10 mg total) by mouth 3 (three) times daily. 10/09/16   Ivery QualeBryant, Hobson, PA-C  diphenhydramine-acetaminophen (TYLENOL PM)  25-500 MG TABS Take 1 tablet by mouth at bedtime as needed (sleep).    [provider]  etodolac (LODINE) 300 MG capsule Take 1 capsule (300 mg total) by mouth every 8 (eight) hours. 11/07/16   Linwood DibblesKnapp, Noris Kulinski, MD  FLUoxetine (PROZAC) 20 MG capsule Take 20 mg by mouth daily.    [provider]  lidocaine (LIDODERM) 5 % Place 1 patch onto the skin daily. Remove & Discard patch within 12 hours or as directed by MD 10/22/16   Burgess AmorIdol, Julie, PA-C  omeprazole (PRILOSEC) 20 MG capsule Take 20 mg by mouth daily.    [provider]  oxyCODONE-acetaminophen (PERCOCET) 7.5-325 MG per tablet Take 1 tablet by mouth 4 (four) times daily. Pain    [provider]  venlafaxine XR (EFFEXOR-XR) 150 MG 24 hr capsule Take 150 mg by mouth daily.    [provider]    Family History History reviewed. No pertinent family history.  Social History Social History  Substance Use Topics  . Smoking status: Current Every Day Smoker    Packs/day: 1.00    Types: Cigarettes  . Smokeless tobacco: Never Used  . Alcohol use No     Allergies   Patient has no known allergies.   Review of Systems Review of Systems  All other systems reviewed and are negative.  Physical Exam Updated Vital Signs BP 136/87   Pulse 92   Temp 98.7 F (37.1 C) (Oral)   Resp 20   Ht 1.626 m ( )   Wt 83.9 kg (185 lb)   SpO2 99%   BMI 31.76 kg/m   Physical Exam  Constitutional: She appears well-developed and well-nourished. No distress.  HENT:  Head: Normocephalic and atraumatic.  Right Ear: External ear normal.  Left Ear: External ear normal.  Eyes: Conjunctivae are normal. Right eye exhibits no discharge. Left eye exhibits no discharge. No scleral icterus.  Neck: Neck supple. No tracheal deviation present.  Cardiovascular: Normal rate.   Pulmonary/Chest: Effort normal. No stridor. No respiratory distress.  Abdominal: She exhibits no distension.  Musculoskeletal: She exhibits no  edema.  Tender to palpation in lumbar spine, no shortness of the extremities,  Neurological: She is alert. Cranial nerve deficit: no gross deficits.  Normal strength and sensation in the lower extremities, 5 out of 5 strength plantar flexion bilaterally  Skin: Skin is warm and dry. No rash noted.  Psychiatric: She has a normal mood and affect.  Nursing note and vitals reviewed.    ED Treatments / Results     Procedures Procedures (including critical care time)  Medications Ordered in ED Medications  HYDROmorphone (DILAUDID) injection 1 mg (1 mg Intramuscular Given 11/07/16 1951)     Initial Impression / Assessment and Plan / ED Course  I have reviewed the triage vital signs and the nursing notes.  Pertinent labs & imaging results that were available during my care of the patient were reviewed by me and considered in my medical decision making (see chart for details).   patient did not want any plain film x-rays. She did not think she fell that hard. She has chronic back pain. She is concerned about her opiate pain medications.  I explained to the patient that it is not appropriate for me to prescribe those for her considering the dangers of those medications. She will need to follow-up with primary care doctor, spine specialist, or pain management doctor to see if occasions are appropriate for her to continue. I will be happy to prescribe non-opiate medications for her pain. Final Clinical Impressions(s) / ED Diagnoses   Final diagnoses:  Chronic low back pain with sciatica, sciatica laterality unspecified, unspecified back pain laterality    New Prescriptions New Prescriptions   ETODOLAC (LODINE) 300 MG CAPSULE    Take 1 capsule (300 mg total) by mouth every 8 (eight) hours.     Linwood Dibbles, MD 11/07/16 [phone]

## 2016-11-07 NOTE — ED Triage Notes (Signed)
Pt reports tripped and fell while at home. Pt reports left hip pain ever since. Pt denies loc or hitting head. No obvious deformity noted.

## 2016-11-25 ENCOUNTER — Emergency Department (HOSPITAL_COMMUNITY): Payer: Medicaid Other

## 2016-11-25 ENCOUNTER — Encounter (HOSPITAL_COMMUNITY): Payer: Self-pay

## 2016-11-25 ENCOUNTER — Emergency Department (HOSPITAL_COMMUNITY)
Admission: EM | Admit: 2016-11-25 | Discharge: 2016-11-25 | Disposition: A | Discharge status: Home / Self Care | Payer: Medicaid Other | Attending: Emergency Medicine | Admitting: Emergency Medicine

## 2016-11-25 DIAGNOSIS — G8929 Other chronic pain: Secondary | ICD-10-CM

## 2016-11-25 DIAGNOSIS — M545 Low back pain, unspecified: Secondary | ICD-10-CM

## 2016-11-25 DIAGNOSIS — M5442 Lumbago with sciatica, left side: Secondary | ICD-10-CM

## 2016-11-25 DIAGNOSIS — I10 Essential (primary) hypertension: Secondary | ICD-10-CM | POA: Insufficient documentation

## 2016-11-25 DIAGNOSIS — Z79899 Other long term (current) drug therapy: Secondary | ICD-10-CM | POA: Diagnosis not present

## 2016-11-25 DIAGNOSIS — F17210 Nicotine dependence, cigarettes, uncomplicated: Secondary | ICD-10-CM | POA: Diagnosis not present

## 2016-11-25 MED ORDER — OXYCODONE-ACETAMINOPHEN 5-325 MG PO TABS
1.0000 | ORAL_TABLET | Freq: Once | ORAL | Status: AC
  Administered 2016-11-25: 1 via ORAL
  Filled 2016-11-25: qty 1

## 2016-11-25 MED ORDER — KETOROLAC TROMETHAMINE 30 MG/ML IJ SOLN
15.0000 mg | Freq: Once | INTRAMUSCULAR | Status: AC
  Administered 2016-11-25: 15 mg via INTRAMUSCULAR
  Filled 2016-11-25: qty 1

## 2016-11-25 MED ORDER — ONDANSETRON 4 MG PO TBDP
4.0000 mg | ORAL_TABLET | Freq: Once | ORAL | Status: AC
  Administered 2016-11-25: 4 mg via ORAL
  Filled 2016-11-25: qty 1

## 2016-11-25 MED ORDER — METHOCARBAMOL 500 MG PO TABS: 500.0000 mg | ORAL_TABLET | Freq: Every evening | ORAL | 0 refills | Status: DC | PRN

## 2016-11-25 MED ORDER — NAPROXEN 500 MG PO TABS: 500.0000 mg | ORAL_TABLET | Freq: Two times a day (BID) | ORAL | 0 refills | Status: DC

## 2016-11-25 MED ORDER — LIDOCAINE 5 % EX PTCH: 1.0000 | MEDICATED_PATCH | CUTANEOUS | 0 refills | Status: DC

## 2016-11-25 NOTE — ED Notes (Signed)
Kara Hart talked with the patient before leaving. Pt began to talk loudly and saying that we had done nothing for her but made her look like a fool. Pt was ambulatory with no distress upon leaving. No obvious signs of pain noted.

## 2016-11-25 NOTE — ED Provider Notes (Signed)
[facility] DEPT Provider Note   CSN: [id number] Arrival date & time: 11/25/16  1445     History   Chief Complaint Chief Complaint  Patient presents with  . Back Pain    HPI Kara Hart is a 56 y.o. femalepresenting via EMS with acute on chronic Lumbar pain and left hip pain after slipping while getting into the tub and falling onto her left hip. She denies any head trauma or loss of consciousness. Patient is not on anticoagulants. She reports chronic decreased sensation in the left lower extremity due to prior surgeries and nerve damage. No new deficits. Patient explains that she was able to get herself up and out of the tub and got dressed prior to calling EMS as she did not know how else to get here. She reports that she did not think she could walk a whole block over here and she does not drive.states that she has been trying to set up her primary care provider but has been having difficulties as every provider she went to see this except her insurance.she states that she has an appointment on October 10 with Dr. Delton See who has already advised her that he would not be able to manage her chronic back pain and she would have to be referred once established. Patient is crying and stating that she doesn't know what to do in the meantime. She reports taking gabapentin and no other medications for pain at this time.  HPI  Past Medical History:  Diagnosis Date  . Anxiety   . Bipolar 1 disorder (HCC)   . Depression   . DJD (degenerative joint disease)   . Heartburn   . Hypertension   . Panic attack   . Scoliosis     There are no active problems to display for this patient.   Past Surgical History:  Procedure Laterality Date  . APPENDECTOMY    . BACK SURGERY      OB History    No data available       Home Medications    Prior to Admission medications   Medication Sig Start Date End Date Taking? Authorizing Provider  cloNIDine (CATAPRES) 0.1 MG tablet Take 0.1 mg  by mouth 2 (two) times daily.    [provider]  cyclobenzaprine (FLEXERIL) 10 MG tablet Take 1 tablet (10 mg total) by mouth 3 (three) times daily. 10/09/16   Ivery Quale, PA-C  diphenhydramine-acetaminophen (TYLENOL PM) 25-500 MG TABS Take 1 tablet by mouth at bedtime as needed (sleep).    [provider]  etodolac (LODINE) 300 MG capsule Take 1 capsule (300 mg total) by mouth every 8 (eight) hours. 11/07/16   Linwood Dibbles, MD  FLUoxetine (PROZAC) 20 MG capsule Take 20 mg by mouth daily.    [provider]  lidocaine (LIDODERM) 5 % Place 1 patch onto the skin daily. Remove & Discard patch within 12 hours or as directed by MD 11/25/16   Georgiana Shore, PA-C  methocarbamol (ROBAXIN) 500 MG tablet Take 1 tablet (500 mg total) by mouth at bedtime as needed for muscle spasms. 11/25/16   Mathews Robinsons B, PA-C  naproxen (NAPROSYN) 500 MG tablet Take 1 tablet (500 mg total) by mouth 2 (two) times daily. 11/25/16   Georgiana Shore, PA-C  omeprazole (PRILOSEC) 20 MG capsule Take 20 mg by mouth daily.    [provider]  oxyCODONE-acetaminophen (PERCOCET) 7.5-325 MG per tablet Take 1 tablet by mouth 4 (four) times daily. Pain  [provider]  venlafaxine XR (EFFEXOR-XR) 150 MG 24 hr capsule Take 150 mg by mouth daily.    [provider]    Family History No family history on file.  Social History Social History  Substance Use Topics  . Smoking status: Current Every Day Smoker    Packs/day: 1.00    Types: Cigarettes  . Smokeless tobacco: Never Used  . Alcohol use No     Allergies   Patient has no known allergies.   Review of Systems Review of Systems  Constitutional: Negative for chills and fever.  Eyes: Negative for pain and visual disturbance.  Respiratory: Negative for cough, choking, chest tightness, shortness of breath, wheezing and stridor.   Cardiovascular: Negative for chest pain and palpitations.  Gastrointestinal:  Negative for abdominal pain, nausea and vomiting.  Genitourinary: Negative for difficulty urinating, dysuria, flank pain and hematuria.  Musculoskeletal: Positive for back pain and myalgias. Negative for arthralgias, neck pain and neck stiffness.  Skin: Negative for color change, pallor and rash.  Neurological: Positive for numbness. Negative for dizziness, seizures, syncope, facial asymmetry, weakness and headaches.       Baseline left lower extremity numbness     Physical Exam Updated Vital Signs BP (!) 131/99 (BP Location: Left Arm)   Pulse 88   Temp 98.3 F (36.8 C) (Oral)   Resp 18   Ht  (1.626 m)   Wt 83.9 kg (185 lb)   SpO2 100%   BMI 31.76 kg/m   Physical Exam  Constitutional: She appears well-developed and well-nourished. No distress.  Afebrile, nontoxic-appearing, sitting in bed labile in apparent discomfort.  HENT:  Head: Normocephalic and atraumatic.  Cardiovascular: Normal rate, regular rhythm, normal heart sounds and intact distal pulses.   No murmur heard. Pulmonary/Chest: Effort normal and breath sounds normal. No respiratory distress. She has no wheezes. She has no rales.  Musculoskeletal: Normal range of motion. She exhibits tenderness. She exhibits no edema or deformity.  Midline tenderness palpation of the lumbar spine and left hip. Patient has an inconsistent exam with distractions.  Neurological: She is alert. She exhibits normal muscle tone.  5 out of 5 strength to hip flexion, knee flexion and extension, plantar flexion dorsiflexion. Strong dorsalis pedis pulses, extremities warm,neurovascularly intact.  Skin: Skin is warm and dry. Capillary refill takes less than 2 seconds. No rash noted. She is not diaphoretic. No erythema. No pallor.  Psychiatric: She has a normal mood and affect.  Nursing note and vitals reviewed.    ED Treatments / Results  Labs (all labs ordered are listed, but only abnormal results are displayed) Labs Reviewed - No data  to display  EKG  EKG Interpretation None       Radiology Dg Lumbar Spine 2-3 Views  Result Date: 11/25/2016 CLINICAL DATA:  Fall with back pain EXAM: LUMBAR SPINE - 2-3 VIEW COMPARISON:  None. FINDINGS: There is no acute fracture of the lumbar spine. Vertebral body heights are maintained. There is moderate L5-S1 disc space narrowing. Alignment is normal. Moderate lower lumbar facet arthrosis. Mild left convex scoliosis. IMPRESSION: No acute abnormality of the lumbar spine. Moderate L5-S1 degenerative disc disease and multilevel lower lumbar facet arthrosis. Electronically Signed   By: Deatra Robinson M.D.   On: 11/25/2016 16:26   Dg Hip Unilat W Or Wo Pelvis 2-3 Views Left  Result Date: 11/25/2016 CLINICAL DATA:  Left hip pain after falling today. EXAM: DG HIP (WITH OR WITHOUT PELVIS) 2-3V LEFT COMPARISON:  None. FINDINGS:  There is no evidence of hip fracture or dislocation. There is no evidence of arthropathy or other focal bone abnormality. IMPRESSION: Normal examination. Electronically Signed   By: Beckie Salts M.D.   On: 11/25/2016 16:21    Procedures Procedures (including critical care time)  Medications Ordered in ED Medications  ondansetron (ZOFRAN-ODT) disintegrating tablet 4 mg (4 mg Oral Given 11/25/16 1524)  oxyCODONE-acetaminophen (PERCOCET/ROXICET) 5-325 MG per tablet 1 tablet (1 tablet Oral Given 11/25/16 1524)  ketorolac (TORADOL) 30 MG/ML injection 15 mg (15 mg Intramuscular Given 11/25/16 1523)     Initial Impression / Assessment and Plan / ED Course  I have reviewed the triage vital signs and the nursing notes.  Pertinent labs & imaging results that were available during my care of the patient were reviewed by me and considered in my medical decision making (see chart for details).    Patient presenting with acute injury on chronic low back pain and hip pain. She has been seen multiple times for same pain and was scheduled to follow-up with primary care to establish  care and referral as needed. She states that she tried to go to Dr. Felecia Shelling but they did not accept new patients with medicaid. she now has an appointment scheduled with Dr. Delton See on October 10th.  X-rays negative for any acute injury. Inconsistent exam with distraction. Patient was requesting specific dosing of Percocet, stated that percocet 5 were like "tictacs" to her.  Patient presents with lower back pain.  No gross neurological deficits and normal neuro exam.  Patient has no gait abnormality or concern for cauda equina.  No loss of bowel or bladder control, fever, night sweats, weight loss, h/o malignancy, or IVDU.  RICE protocol and pain medications indicated and discussed with patient.    Urged patient to follow up with PCP for management of chronic back pain and appropriate referral.  Discussed plan with patient and she became upset that she was not being prescribed anything stronger for pain and she proceeded to walk out of the room in aggressive manner.  Attempted to explain to patient that she needed to wait to have her prescriptions and she agreed to return to her room to wait for her discharge paperwork.  I have reviewed records of multiple ED visits with similar or other pain related complaints, usually with negative workups. I feel that the patient's pain is chronic and cannot be appropriately or safely treated in an emergency department setting.   I do not feel that providing narcotic pain medication is in this patient's best interest. I have urged the patient to have close follow up with their provider or pain specialist and have provided the adequate resources for this. I have explicitly discussed with the patient return precautions and have reassured patient that they can always be seen and evaluated in the emergency department for any condition that they feel is emergent, and that they will be given treatment as the EDP feels is appropriate and safe, but this may not involve the use  of narcotic pain medications. The patient was given the opportunity to voice any further questions or concerns and these were addressed to the best of my ability.   Discussed strict return precautions and advised to return to the emergency department if experiencing any new or worsening symptoms. Instructions were understood and patient agreed with discharge plan.  Final Clinical Impressions(s) / ED Diagnoses   Final diagnoses:  Acute left-sided low back pain without sciatica    New Prescriptions  Discharge Medication List as of 11/25/2016  5:09 PM    START taking these medications   Details  methocarbamol (ROBAXIN) 500 MG tablet Take 1 tablet (500 mg total) by mouth at bedtime as needed for muscle spasms., Starting Sun 11/25/2016, Print    naproxen (NAPROSYN) 500 MG tablet Take 1 tablet (500 mg total) by mouth 2 (two) times daily., Starting Sun 11/25/2016, Print         Mathews Robinsons B, PA-C 11/25/16 1910    Pricilla Loveless, MD 11/27/16 [phone]

## 2016-11-25 NOTE — ED Triage Notes (Signed)
Patient reports of lower back pain today after falling while trying to get into bath tub. Denies any other injuries or loc.

## 2016-11-25 NOTE — ED Notes (Signed)
ED Provider at bedside. 

## 2016-11-25 NOTE — ED Notes (Signed)
Xray tech went in the room to get the patient and she began to raise her voice and say, "They just give me a pill for my pain and it was only 5 mg. How do you expect me to not be in pain during this xray. I can't do this right now, you will need to get my nurse." Pt began to raise voice at this nurse and stated that she wasn't happy we only gave her a 5 mg pill. This nurse made patient aware that that was the dose the prescriber ordered and we gave her an IM shot and nausea medication as well. We came to the agreement to wait 25-30 minutes before going to xray.

## 2016-11-25 NOTE — Discharge Instructions (Signed)
As discusssed, your xrays did not show any new injuries. Apply lidocaine patch to your lower back for 12 hours on and 12 hours off.  Muscle relaxant for night time as needed. Do not drive or operate machinery while on this medication.  Follow up with Dr. Delton See for chronic pain management and appropriate referral.  Return if you experience new concerning symptoms in the meantime.

## 2016-11-25 NOTE — ED Notes (Signed)
Pt called out to nurses desk and states she is ready to go to xray because her "5 mg pill won't make a difference anyway."

## 2016-11-25 NOTE — ED Notes (Signed)
Pt states her pain hasn't change any and that a "5 mg pill won't work."

## 2016-12-05 ENCOUNTER — Ambulatory Visit: Payer: Medicaid Other | Admitting: Family Medicine

## 2017-02-04 ENCOUNTER — Encounter (HOSPITAL_COMMUNITY): Payer: Self-pay | Admitting: Cardiology

## 2017-02-04 ENCOUNTER — Emergency Department (HOSPITAL_COMMUNITY)
Admission: EM | Admit: 2017-02-04 | Discharge: 2017-02-04 | Disposition: A | Discharge status: Home / Self Care | Payer: Medicaid Other | Attending: Emergency Medicine | Admitting: Emergency Medicine

## 2017-02-04 ENCOUNTER — Emergency Department (HOSPITAL_COMMUNITY): Payer: Medicaid Other

## 2017-02-04 DIAGNOSIS — I10 Essential (primary) hypertension: Secondary | ICD-10-CM | POA: Diagnosis not present

## 2017-02-04 DIAGNOSIS — Z79899 Other long term (current) drug therapy: Secondary | ICD-10-CM | POA: Insufficient documentation

## 2017-02-04 DIAGNOSIS — W19XXXA Unspecified fall, initial encounter: Secondary | ICD-10-CM

## 2017-02-04 DIAGNOSIS — R51 Headache: Secondary | ICD-10-CM | POA: Insufficient documentation

## 2017-02-04 DIAGNOSIS — M542 Cervicalgia: Secondary | ICD-10-CM | POA: Insufficient documentation

## 2017-02-04 DIAGNOSIS — F17210 Nicotine dependence, cigarettes, uncomplicated: Secondary | ICD-10-CM | POA: Diagnosis not present

## 2017-02-04 DIAGNOSIS — M545 Low back pain: Secondary | ICD-10-CM | POA: Insufficient documentation

## 2017-02-04 HISTORY — DX: Dorsalgia, unspecified: M54.9

## 2017-02-04 HISTORY — DX: Other chronic pain: G89.29

## 2017-02-04 MED ORDER — HYDROMORPHONE HCL 1 MG/ML IJ SOLN
1.0000 mg | Freq: Once | INTRAMUSCULAR | Status: AC
  Administered 2017-02-04: 1 mg via INTRAMUSCULAR
  Filled 2017-02-04: qty 1

## 2017-02-04 MED ORDER — ONDANSETRON 4 MG PO TBDP
4.0000 mg | ORAL_TABLET | Freq: Once | ORAL | Status: AC
  Administered 2017-02-04: 4 mg via ORAL
  Filled 2017-02-04: qty 1

## 2017-02-04 MED ORDER — HYDROXYZINE HCL 25 MG PO TABS: ORAL_TABLET | ORAL | 1 refills | Status: AC

## 2017-02-04 MED ORDER — OXYCODONE HCL 5 MG PO TABS: 5.0000 mg | ORAL_TABLET | Freq: Four times a day (QID) | ORAL | 0 refills | Status: DC | PRN

## 2017-02-04 NOTE — ED Triage Notes (Signed)
Fall in yard today.  C/o lower back and neck pain.

## 2017-02-04 NOTE — ED Notes (Signed)
Pt very upset that hospital does give cab voucher. Pt spoke with supervisor and charge nurse. Pt  Verbally abusive and agitated. Stating give me my paperwork and I will walk home. States to this nurse that we cant even give her 7.00 to get her home. Offered to wheel her waiting room. Pt refused stating I dont want shit from you, you dont care

## 2017-02-04 NOTE — ED Notes (Signed)
Pt assisted to BR and then transported to CT

## 2017-02-04 NOTE — ED Provider Notes (Signed)
[facility] EMERGENCY DEPARTMENT Provider Note   CSN: [id number] Arrival date & time: 02/04/17  1302     History   Chief Complaint No chief complaint on file.   HPI Kara Hart is a 56 y.o. female.  Patient states that she slipped and fell hit her head did not have loss consciousness she has neck and lower back and a headache   The history is provided by the patient. No language interpreter was used.  Fall  This is a new problem. The current episode started 12 to 24 hours ago. The problem occurs rarely. The problem has not changed since onset.Pertinent negatives include no chest pain, no abdominal pain and no headaches. Nothing aggravates the symptoms.    Past Medical History:  Diagnosis Date  . Anxiety   . Bipolar 1 disorder (HCC)   . Chronic back pain   . Depression   . DJD (degenerative joint disease)   . Heartburn   . Hypertension   . Panic attack   . Scoliosis     There are no active problems to display for this patient.   Past Surgical History:  Procedure Laterality Date  . APPENDECTOMY    . BACK SURGERY      OB History    No data available       Home Medications    Prior to Admission medications   Medication Sig Start Date End Date Taking? Authorizing Provider  diphenhydramine-acetaminophen (TYLENOL PM) 25-500 MG TABS Take 1 tablet by mouth at bedtime as needed (sleep).   Yes [provider]  omeprazole (PRILOSEC) 20 MG capsule Take 20 mg by mouth daily.   Yes [provider]  venlafaxine XR (EFFEXOR-XR) 150 MG 24 hr capsule Take 150 mg by mouth daily.   Yes [provider]  cyclobenzaprine (FLEXERIL) 10 MG tablet Take 1 tablet (10 mg total) by mouth 3 (three) times daily. Patient not taking: Reported on 02/04/2017 10/09/16   Ivery Quale, PA-C  etodolac (LODINE) 300 MG capsule Take 1 capsule (300 mg total) by mouth every 8 (eight) hours. Patient not taking: Reported on 02/04/2017 11/07/16   Linwood Dibbles, MD    hydrOXYzine (ATARAX/VISTARIL) 25 MG tablet Take one at night if needed for sleep 02/04/17   Bethann Berkshire, MD  lidocaine (LIDODERM) 5 % Place 1 patch onto the skin daily. Remove & Discard patch within 12 hours or as directed by MD Patient not taking: Reported on 02/04/2017 11/25/16   Georgiana Shore, PA-C  methocarbamol (ROBAXIN) 500 MG tablet Take 1 tablet (500 mg total) by mouth at bedtime as needed for muscle spasms. Patient not taking: Reported on 02/04/2017 11/25/16   Mathews Robinsons B, PA-C  naproxen (NAPROSYN) 500 MG tablet Take 1 tablet (500 mg total) by mouth 2 (two) times daily. Patient not taking: Reported on 02/04/2017 11/25/16   Mathews Robinsons B, PA-C  oxyCODONE (ROXICODONE) 5 MG immediate release tablet Take 1 tablet (5 mg total) by mouth every 6 (six) hours as needed for severe pain. 02/04/17   Bethann Berkshire, MD    Family History History reviewed. No pertinent family history.  Social History Social History   Tobacco Use  . Smoking status: Current Every Day Smoker    Packs/day: 1.00    Types: Cigarettes  . Smokeless tobacco: Never Used  Substance Use Topics  . Alcohol use: No  . Drug use: No     Allergies   Patient has no known allergies.   Review of Systems Review  of Systems  Constitutional: Negative for appetite change and fatigue.  HENT: Negative for congestion, ear discharge and sinus pressure.        Headache and neck pain  Eyes: Negative for discharge.  Respiratory: Negative for cough.   Cardiovascular: Negative for chest pain.  Gastrointestinal: Negative for abdominal pain and diarrhea.  Genitourinary: Negative for frequency and hematuria.  Musculoskeletal: Positive for back pain.  Skin: Negative for rash.  Neurological: Negative for seizures and headaches.  Psychiatric/Behavioral: Negative for hallucinations.     Physical Exam Updated Vital Signs BP 127/86 (BP Location: Right Arm)   Pulse 98   Temp 98.6 F (37 C) (Oral)   Resp 16    Ht 5\' 4"  (1.626 m)   Wt 83.9 kg (185 lb)   SpO2 96%   BMI 31.76 kg/m   Physical Exam  Constitutional: She is oriented to person, place, and time. She appears well-developed.  HENT:  Head: Normocephalic.  Tender posterior neck  Eyes: Conjunctivae and EOM are normal. No scleral icterus.  Neck: Neck supple. No thyromegaly present.  Cardiovascular: Normal rate and regular rhythm. Exam reveals no gallop and no friction rub.  No murmur heard. Pulmonary/Chest: No stridor. She has no wheezes. She has no rales. She exhibits no tenderness.  Abdominal: She exhibits no distension. There is no tenderness. There is no rebound.  Musculoskeletal: Normal range of motion. She exhibits no edema.  Tender lumbar spine  Lymphadenopathy:    She has no cervical adenopathy.  Neurological: She is oriented to person, place, and time. She displays normal reflexes. No cranial nerve deficit. She exhibits normal muscle tone. Coordination normal.  Skin: No rash noted. No erythema.  Psychiatric: She has a normal mood and affect. Her behavior is normal.     ED Treatments / Results  Labs (all labs ordered are listed, but only abnormal results are displayed) Labs Reviewed - No data to display  EKG  EKG Interpretation None       Radiology Ct Abdomen Pelvis Wo Contrast  Result Date: 02/04/2017 CLINICAL DATA:  Fall with low back pain. EXAM: CT ABDOMEN AND PELVIS WITHOUT CONTRAST TECHNIQUE: Multidetector CT imaging of the abdomen and pelvis was performed following the standard protocol without IV contrast. COMPARISON:  Lumbar spine radiographs on 11/25/2016 FINDINGS: Lower chest: No acute abnormality. Hepatobiliary: The liver shows nodular surface contour or with enlargement of the left lobe consistent with cirrhosis. Multiple calcified gallstones are present within the gallbladder. No evidence of gallbladder inflammation or biliary ductal dilatation. Pancreas: Unremarkable. No pancreatic ductal dilatation or  surrounding inflammatory changes. Spleen: The spleen is moderately enlarged. Adrenals/Urinary Tract: Adrenal glands appear unremarkable by unenhanced CT. Kidneys show no evidence of hydronephrosis. There are some punctate nonobstructing calculi within the lower pole of the right kidney the posterior lower collecting system of the left kidney. Ureters bladder are unremarkable. Stomach/Bowel: Bowel shows no evidence of obstruction or inflammation. No free air identified. Vascular/Lymphatic: No enlarged lymph nodes identified in the abdomen or pelvis. Some small lymph nodes in the porta hepatis and peripancreatic region are likely reactive and associated with cirrhosis. Calcified plaque noted in the distal aorta iliac arteries without evidence of aneurysm. Reproductive: Uterus and bilateral adnexa are unremarkable. Other: No ascites. Probable small right inguinal hernia containing fat. Musculoskeletal: Moderate disc space narrowing at the L5-S1 level with vacuum disc. No evidence of lumbar fracture or subluxation. Leftward convex scoliosis present. No bony lesions. IMPRESSION: 1. Evidence of hepatic cirrhosis. Associated splenomegaly. No ascites. 2. Cholelithiasis  with several calcified gallstones identified in the gallbladder. 3. Tiny nonobstructing calculi in both kidneys. 4. Probable small right inguinal hernia containing fat. 5. Moderate degenerative disc disease at L5-S1. Leftward convex scoliosis of the lumbar spine. Findings similar to lumbar x-rays on 11/25/2016. No acute lumbar fracture identified. Electronically Signed   By: Irish LackGlenn  Yamagata M.D.   On: 02/04/2017 14:41   Ct Head Wo Contrast  Result Date: 02/04/2017 CLINICAL DATA:  Headache and neck pain after fall in yard today. EXAM: CT HEAD WITHOUT CONTRAST CT CERVICAL SPINE WITHOUT CONTRAST TECHNIQUE: Multidetector CT imaging of the head and cervical spine was performed following the standard protocol without intravenous contrast. Multiplanar CT image  reconstructions of the cervical spine were also generated. COMPARISON:  None. FINDINGS: CT HEAD FINDINGS Brain: Wallace CullensGray- white matter distinction is maintained. No hydrocephalus, large vascular territory infarction, edema or midline shift. Streak artifacts along the concavity of the skull limit assessment. No conclusive evidence for acute intracranial hemorrhage. Midline fourth ventricle and basal cisterns. Vascular: No hyperdense vessel or unexpected calcification. Skull: Negative for fracture or focal lesion. Sinuses/Orbits: No acute finding. Other: Mildly sclerosis and under pneumatized appearance of the left mastoid. No significant mastoid effusion. CT CERVICAL SPINE FINDINGS Alignment: Intact craniocervical relationship and atlantodental interval. Reversal of cervical lordosis attributable to multilevel degenerative disc disease. Mild levoconvex curvature of the cervical spine is also noted. Skull base and vertebrae: No acute cervical spine fracture or posttraumatic listhesis. No jumped or perched facets. Soft tissues and spinal canal: No prevertebral fluid or swelling. No visible canal hematoma. Disc levels: Disc-osteophyte complexes C4 through C7 most prominent at C6-7. Uncovertebral joint osteoarthritis with uncinate spurring C4 through C7 bilaterally. This results in mild right-sided neural foraminal encroachment from osteophytes from C4 through C7. Upper chest: Negative. Other: None IMPRESSION: 1. Normal head CT. 2. Cervical spondylosis with degenerative disc disease from C4-5 through C6-7. 3. No acute cervical spine fracture or posttraumatic listhesis. Electronically Signed   By: Tollie Ethavid  Kwon M.D.   On: 02/04/2017 14:42   Ct Cervical Spine Wo Contrast  Result Date: 02/04/2017 CLINICAL DATA:  Headache and neck pain after fall in yard today. EXAM: CT HEAD WITHOUT CONTRAST CT CERVICAL SPINE WITHOUT CONTRAST TECHNIQUE: Multidetector CT imaging of the head and cervical spine was performed following the  standard protocol without intravenous contrast. Multiplanar CT image reconstructions of the cervical spine were also generated. COMPARISON:  None. FINDINGS: CT HEAD FINDINGS Brain: Wallace CullensGray- white matter distinction is maintained. No hydrocephalus, large vascular territory infarction, edema or midline shift. Streak artifacts along the concavity of the skull limit assessment. No conclusive evidence for acute intracranial hemorrhage. Midline fourth ventricle and basal cisterns. Vascular: No hyperdense vessel or unexpected calcification. Skull: Negative for fracture or focal lesion. Sinuses/Orbits: No acute finding. Other: Mildly sclerosis and under pneumatized appearance of the left mastoid. No significant mastoid effusion. CT CERVICAL SPINE FINDINGS Alignment: Intact craniocervical relationship and atlantodental interval. Reversal of cervical lordosis attributable to multilevel degenerative disc disease. Mild levoconvex curvature of the cervical spine is also noted. Skull base and vertebrae: No acute cervical spine fracture or posttraumatic listhesis. No jumped or perched facets. Soft tissues and spinal canal: No prevertebral fluid or swelling. No visible canal hematoma. Disc levels: Disc-osteophyte complexes C4 through C7 most prominent at C6-7. Uncovertebral joint osteoarthritis with uncinate spurring C4 through C7 bilaterally. This results in mild right-sided neural foraminal encroachment from osteophytes from C4 through C7. Upper chest: Negative. Other: None IMPRESSION: 1. Normal head CT. 2. Cervical  spondylosis with degenerative disc disease from C4-5 through C6-7. 3. No acute cervical spine fracture or posttraumatic listhesis. Electronically Signed   By: Tollie Ethavid  Kwon M.D.   On: 02/04/2017 14:42    Procedures Procedures (including critical care time)  Medications Ordered in ED Medications  HYDROmorphone (DILAUDID) injection 1 mg (1 mg Intramuscular Given 02/04/17 1354)  ondansetron (ZOFRAN-ODT)  disintegrating tablet 4 mg (4 mg Oral Given 02/04/17 1354)     Initial Impression / Assessment and Plan / ED Course  I have reviewed the triage vital signs and the nursing notes.  Pertinent labs & imaging results that were available during my care of the patient were reviewed by me and considered in my medical decision making (see chart for details).     Patient with fall contusion to head cervical strain lumbar strain.  She is given some pain medicine along with some medicine to help sleep.  She is to follow-up with a PCP  Final Clinical Impressions(s) / ED Diagnoses   Final diagnoses:  Fall, initial encounter    ED Discharge Orders        Ordered    oxyCODONE (ROXICODONE) 5 MG immediate release tablet  Every 6 hours PRN     02/04/17 1511    hydrOXYzine (ATARAX/VISTARIL) 25 MG tablet     02/04/17 1511       Bethann BerkshireZammit, Olina Melfi, MD 02/04/17 1515

## 2017-02-04 NOTE — Discharge Instructions (Signed)
Follow-up with a primary care doctor. °

## 2017-07-01 ENCOUNTER — Emergency Department (HOSPITAL_COMMUNITY): Payer: Medicaid Other

## 2017-07-01 ENCOUNTER — Encounter (HOSPITAL_COMMUNITY): Payer: Self-pay | Admitting: Emergency Medicine

## 2017-07-01 ENCOUNTER — Other Ambulatory Visit: Payer: Self-pay

## 2017-07-01 ENCOUNTER — Emergency Department (HOSPITAL_COMMUNITY)
Admission: EM | Admit: 2017-07-01 | Discharge: 2017-07-01 | Disposition: A | Discharge status: Home / Self Care | Payer: Medicaid Other | Attending: Emergency Medicine | Admitting: Emergency Medicine

## 2017-07-01 DIAGNOSIS — M79604 Pain in right leg: Secondary | ICD-10-CM | POA: Diagnosis not present

## 2017-07-01 DIAGNOSIS — F17210 Nicotine dependence, cigarettes, uncomplicated: Secondary | ICD-10-CM | POA: Insufficient documentation

## 2017-07-01 DIAGNOSIS — S39012A Strain of muscle, fascia and tendon of lower back, initial encounter: Secondary | ICD-10-CM | POA: Diagnosis not present

## 2017-07-01 DIAGNOSIS — Z79899 Other long term (current) drug therapy: Secondary | ICD-10-CM | POA: Insufficient documentation

## 2017-07-01 DIAGNOSIS — T07XXXA Unspecified multiple injuries, initial encounter: Secondary | ICD-10-CM

## 2017-07-01 DIAGNOSIS — Y939 Activity, unspecified: Secondary | ICD-10-CM | POA: Diagnosis not present

## 2017-07-01 DIAGNOSIS — Y999 Unspecified external cause status: Secondary | ICD-10-CM | POA: Diagnosis not present

## 2017-07-01 DIAGNOSIS — Z8739 Personal history of other diseases of the musculoskeletal system and connective tissue: Secondary | ICD-10-CM | POA: Insufficient documentation

## 2017-07-01 DIAGNOSIS — Y929 Unspecified place or not applicable: Secondary | ICD-10-CM | POA: Insufficient documentation

## 2017-07-01 DIAGNOSIS — I10 Essential (primary) hypertension: Secondary | ICD-10-CM | POA: Insufficient documentation

## 2017-07-01 DIAGNOSIS — W19XXXA Unspecified fall, initial encounter: Secondary | ICD-10-CM | POA: Insufficient documentation

## 2017-07-01 DIAGNOSIS — S80211A Abrasion, right knee, initial encounter: Secondary | ICD-10-CM | POA: Insufficient documentation

## 2017-07-01 DIAGNOSIS — M79641 Pain in right hand: Secondary | ICD-10-CM | POA: Insufficient documentation

## 2017-07-01 DIAGNOSIS — S3992XA Unspecified injury of lower back, initial encounter: Secondary | ICD-10-CM | POA: Diagnosis present

## 2017-07-01 MED ORDER — DEXAMETHASONE SODIUM PHOSPHATE 10 MG/ML IJ SOLN
10.0000 mg | Freq: Once | INTRAMUSCULAR | Status: AC
  Administered 2017-07-01: 10 mg via INTRAMUSCULAR
  Filled 2017-07-01: qty 1

## 2017-07-01 MED ORDER — KETOROLAC TROMETHAMINE 10 MG PO TABS
10.0000 mg | ORAL_TABLET | Freq: Once | ORAL | Status: AC
  Administered 2017-07-01: 10 mg via ORAL
  Filled 2017-07-01: qty 1

## 2017-07-01 MED ORDER — DICLOFENAC SODIUM 75 MG PO TBEC: 75.0000 mg | DELAYED_RELEASE_TABLET | Freq: Two times a day (BID) | ORAL | 0 refills | Status: AC

## 2017-07-01 MED ORDER — DIAZEPAM 5 MG PO TABS
10.0000 mg | ORAL_TABLET | Freq: Once | ORAL | Status: AC
  Administered 2017-07-01: 10 mg via ORAL
  Filled 2017-07-01: qty 2

## 2017-07-01 MED ORDER — ONDANSETRON HCL 4 MG PO TABS
4.0000 mg | ORAL_TABLET | Freq: Once | ORAL | Status: AC
  Administered 2017-07-01: 4 mg via ORAL
  Filled 2017-07-01: qty 1

## 2017-07-01 MED ORDER — CYCLOBENZAPRINE HCL 10 MG PO TABS: 10.0000 mg | ORAL_TABLET | Freq: Three times a day (TID) | ORAL | 0 refills | Status: AC

## 2017-07-01 MED ORDER — TRAMADOL HCL 50 MG PO TABS
100.0000 mg | ORAL_TABLET | Freq: Once | ORAL | Status: AC
  Administered 2017-07-01: 100 mg via ORAL
  Filled 2017-07-01: qty 2

## 2017-07-01 MED ORDER — DEXAMETHASONE 4 MG PO TABS: 4.0000 mg | ORAL_TABLET | Freq: Two times a day (BID) | ORAL | 0 refills | Status: AC

## 2017-07-01 NOTE — ED Provider Notes (Signed)
[facility] EMERGENCY DEPARTMENT Provider Note   CSN: [id number] Arrival date & time: 07/01/17  1455     History   Chief Complaint Chief Complaint  Patient presents with  . Leg Pain  . Back Pain    HPI Kara Hart is a 57 y.o. female.  Patient is a 57 year old female who presents to the emergency department with a complaint of pain in multiple areas following a fall.  Patient states that just prior to arrival here in the emergency department she sustained a fall going down steps from her deck.  The patient states that several years ago she had back surgery, it has left her with "nerve damage" involving the left lower extremity.  She says at times the leg just gives away with her and that is what happened when she was going down the steps from the deck.  She states that she injured her back.  She has chronic pain related to her back.  She has been told by orthopedic specialist in [place], [place] that she is quotation marks bone-on-bone".  She states that she can make even slight movements and now she has severe pain of her lower back.  No loss of bowel or bladder function.Patient also complains of pain of her right hand and also her right leg.  She presents now for assistance with her pain and evaluation of her injury.  The history is provided by the patient.  Leg Pain    Back Pain   Associated symptoms include leg pain. Pertinent negatives include no chest pain, no abdominal pain and no dysuria.    Past Medical History:  Diagnosis Date  . Anxiety   . Bipolar 1 disorder (HCC)   . Chronic back pain   . Depression   . DJD (degenerative joint disease)   . Heartburn   . Hypertension   . Panic attack   . Scoliosis     There are no active problems to display for this patient.   Past Surgical History:  Procedure Laterality Date  . APPENDECTOMY    . BACK SURGERY       OB History   None      Home Medications    Prior to Admission medications     Medication Sig Start Date End Date Taking? Authorizing Provider  cyclobenzaprine (FLEXERIL) 10 MG tablet Take 1 tablet (10 mg total) by mouth 3 (three) times daily. Patient not taking: Reported on 02/04/2017 10/09/16   Ivery Quale, PA-C  diphenhydramine-acetaminophen (TYLENOL PM) 25-500 MG TABS Take 1 tablet by mouth at bedtime as needed (sleep).    [provider]  etodolac (LODINE) 300 MG capsule Take 1 capsule (300 mg total) by mouth every 8 (eight) hours. Patient not taking: Reported on 02/04/2017 11/07/16   Linwood Dibbles, MD  hydrOXYzine (ATARAX/VISTARIL) 25 MG tablet Take one at night if needed for sleep 02/04/17   Bethann Berkshire, MD  lidocaine (LIDODERM) 5 % Place 1 patch onto the skin daily. Remove & Discard patch within 12 hours or as directed by MD Patient not taking: Reported on 02/04/2017 11/25/16   Georgiana Shore, PA-C  methocarbamol (ROBAXIN) 500 MG tablet Take 1 tablet (500 mg total) by mouth at bedtime as needed for muscle spasms. Patient not taking: Reported on 02/04/2017 11/25/16   Mathews Robinsons B, PA-C  naproxen (NAPROSYN) 500 MG tablet Take 1 tablet (500 mg total) by mouth 2 (two) times daily. Patient not taking: Reported on 02/04/2017 11/25/16   Georgiana Shore,  PA-C  omeprazole (PRILOSEC) 20 MG capsule Take 20 mg by mouth daily.    [provider]  oxyCODONE (ROXICODONE) 5 MG immediate release tablet Take 1 tablet (5 mg total) by mouth every 6 (six) hours as needed for severe pain. 02/04/17   Bethann Berkshire, MD  venlafaxine XR (EFFEXOR-XR) 150 MG 24 hr capsule Take 150 mg by mouth daily.    [provider]    Family History No family history on file.  Social History Social History   Tobacco Use  . Smoking status: Current Every Day Smoker    Packs/day: 1.00    Types: Cigarettes  . Smokeless tobacco: Never Used  Substance Use Topics  . Alcohol use: No  . Drug use: No     Allergies   Patient has no known  allergies.   Review of Systems Review of Systems  Constitutional: Negative for activity change.       All ROS Neg except as noted in HPI  HENT: Negative for nosebleeds.   Eyes: Negative for photophobia and discharge.  Respiratory: Negative for cough, shortness of breath and wheezing.   Cardiovascular: Negative for chest pain and palpitations.  Gastrointestinal: Negative for abdominal pain and blood in stool.  Genitourinary: Negative for dysuria, frequency and hematuria.  Musculoskeletal: Positive for arthralgias and back pain. Negative for neck pain.  Skin: Negative.   Neurological: Negative for dizziness, seizures and speech difficulty.  Psychiatric/Behavioral: Negative for confusion and hallucinations. The patient is nervous/anxious.      Physical Exam Updated Vital Signs BP (!) 141/109 (BP Location: Right Arm)   Pulse 83   Temp 98 F (36.7 C) (Oral)   Resp 14   Ht  (1.626 m)   Wt 83.9 kg (185 lb)   SpO2 97%   BMI 31.76 kg/m   Physical Exam  Constitutional: She is oriented to person, place, and time. She appears well-developed and well-nourished.  Non-toxic appearance.  HENT:  Head: Normocephalic.  Right Ear: Tympanic membrane and external ear normal.  Left Ear: Tympanic membrane and external ear normal.  Eyes: Pupils are equal, round, and reactive to light. EOM and lids are normal.  Neck: Normal range of motion. Neck supple. Carotid bruit is not present.  Cardiovascular: Normal rate, regular rhythm, normal heart sounds, intact distal pulses and normal pulses.  Pulmonary/Chest: Breath sounds normal. No respiratory distress.  Abdominal: Soft. Bowel sounds are normal. There is no tenderness. There is no guarding.  Musculoskeletal:       Lumbar back: She exhibits decreased range of motion, pain and spasm.  There is spasm noted of the lower back in the paraspinal areas.  There is a well-healed surgical scar at the lumbar area.  There is pain to even mild touch in the  paraspinal area.  There is full range of motion of the right hand.  There is no deformity appreciated.  There is an abrasion to the right knee.  There is no effusion noted.  There is good range of motion of the right knee.  There is no pain to palpation of the right or left hip area and no palpable deformity.  Lymphadenopathy:       Head (right side): No submandibular adenopathy present.       Head (left side): No submandibular adenopathy present.    She has no cervical adenopathy.  Neurological: She is alert and oriented to person, place, and time. She has normal strength. No cranial nerve deficit.  There is decreased sensation  of the anterior tibial area on the left extending down to the foot.  This is not new.  The patient states that she has had this since her most recent back surgery several years ago. No saddle paresthesia  Skin: Skin is warm and dry.  Psychiatric: She has a normal mood and affect. Her speech is normal.  Nursing note and vitals reviewed.    ED Treatments / Results  Labs (all labs ordered are listed, but only abnormal results are displayed) Labs Reviewed - No data to display  EKG None  Radiology Dg Knee 1-2 Views Right  Result Date: 07/01/2017 CLINICAL DATA:  Fall EXAM: RIGHT KNEE - 1-2 VIEW COMPARISON:  None. FINDINGS: No acute fracture. No dislocation. Small joint effusion. Mild degenerative change. IMPRESSION: No acute bony pathology.  Small joint effusion is noted. Electronically Signed   By: Jolaine Click M.D.   On: 07/01/2017 15:50    Procedures Procedures (including critical care time)  Medications Ordered in ED Medications - No data to display   Initial Impression / Assessment and Plan / ED Course  I have reviewed the triage vital signs and the nursing notes.  Pertinent labs & imaging results that were available during my care of the patient were reviewed by me and considered in my medical decision making (see chart for details).        Final Clinical Impressions(s) / ED Diagnoses MDM  Blood pressure is elevated, otherwise vital signs within normal limits.  Pulse oximetry is 97% on room air.  The patient denies hitting her head and there are no signs of facial or scalp hematoma.  The patient speaks in complete sentences and there is no evidence of injury to the chest or torso area.  There is no evidence of injury to the right or left hip.  The patient has had previous back surgery and continues to have problems with her back.  She has been toe that she has advanced degenerative disc disease.  Her examination favors contusion to the back as well as spasm present.  No gross neurologic deficit appreciated.  Prescription for muscle relaxer, steroid, and anti-inflammatory pain medication given to the patient.  I have asked the patient to speak with her primary physician to see if the referral to orthopedics could be advanced given her falls and increased problem with pain.  Patient acknowledged understanding of these instructions.   Final diagnoses:  Fall  Strain of lumbar region, initial encounter  Contusion, multiple sites  History of degenerative disc disease    ED Discharge Orders        Ordered    cyclobenzaprine (FLEXERIL) 10 MG tablet  3 times daily     07/01/17 1720    dexamethasone (DECADRON) 4 MG tablet  2 times daily with meals     07/01/17 1720    diclofenac (VOLTAREN) 75 MG EC tablet  2 times daily     07/01/17 1720       Ivery Quale, PA-C 07/01/17 1734    Terrilee Files, MD 07/02/17 [phone]

## 2017-07-01 NOTE — Discharge Instructions (Signed)
Your blood pressure is elevated at 141/109, otherwise your vital signs are essentially within normal limits.  Your examination suggest contusion of multiple areas.  There is evidence of spasm as result of strain or sprain in the lumbar area near your surgical site.  No gross neurologic deficits appreciated on today's exam.  Please see your orthopedic referral from your primary physician as soon as possible.  Rest her back is much as possible.  Use a walker when up and about to reduce the risk of fall.  Use Flexeril 3 times daily for the spasm pain.  This medication may cause drowsiness, please use it with caution getting about.  Please use Decadron and diclofenac 2 times daily with a meal.

## 2017-07-01 NOTE — ED Triage Notes (Signed)
Pt fell down the stairs today landing on her right leg, right side of back.  C/o of right leg, back and right hand pain.  Pt states" my leg gave out and caused me to fall"

## 2017-07-20 ENCOUNTER — Encounter (HOSPITAL_COMMUNITY): Payer: Self-pay | Admitting: Emergency Medicine

## 2017-07-20 ENCOUNTER — Other Ambulatory Visit: Payer: Self-pay

## 2017-07-20 ENCOUNTER — Emergency Department (HOSPITAL_COMMUNITY)
Admission: EM | Admit: 2017-07-20 | Discharge: 2017-07-20 | Disposition: A | Discharge status: Home / Self Care | Payer: Medicaid Other | Attending: Emergency Medicine | Admitting: Emergency Medicine

## 2017-07-20 DIAGNOSIS — M545 Low back pain, unspecified: Secondary | ICD-10-CM

## 2017-07-20 DIAGNOSIS — F17210 Nicotine dependence, cigarettes, uncomplicated: Secondary | ICD-10-CM | POA: Diagnosis not present

## 2017-07-20 DIAGNOSIS — G8929 Other chronic pain: Secondary | ICD-10-CM | POA: Diagnosis not present

## 2017-07-20 DIAGNOSIS — Z79899 Other long term (current) drug therapy: Secondary | ICD-10-CM | POA: Insufficient documentation

## 2017-07-20 DIAGNOSIS — I10 Essential (primary) hypertension: Secondary | ICD-10-CM | POA: Insufficient documentation

## 2017-07-20 LAB — URINALYSIS, ROUTINE W REFLEX MICROSCOPIC
Bacteria, UA: NONE SEEN
Bilirubin Urine: NEGATIVE
GLUCOSE, UA: NEGATIVE mg/dL
Ketones, ur: NEGATIVE mg/dL
LEUKOCYTES UA: NEGATIVE
Nitrite: NEGATIVE
PH: 7 (ref 5.0–8.0)
PROTEIN: NEGATIVE mg/dL
SPECIFIC GRAVITY, URINE: 1.002 — AB (ref 1.005–1.030)

## 2017-07-20 LAB — CBC WITH DIFFERENTIAL/PLATELET
Basophils Absolute: 0.1 10*3/uL (ref 0.0–0.1)
Basophils Relative: 1 %
Eosinophils Absolute: 0.2 10*3/uL (ref 0.0–0.7)
Eosinophils Relative: 3 %
HEMATOCRIT: 36 % (ref 36.0–46.0)
Hemoglobin: 12.2 g/dL (ref 12.0–15.0)
LYMPHS PCT: 37 %
Lymphs Abs: 2.7 10*3/uL (ref 0.7–4.0)
MCH: 34.3 pg — ABNORMAL HIGH (ref 26.0–34.0)
MCHC: 33.9 g/dL (ref 30.0–36.0)
MCV: 101.1 fL — AB (ref 78.0–100.0)
MONO ABS: 0.6 10*3/uL (ref 0.1–1.0)
Monocytes Relative: 8 %
NEUTROS ABS: 3.9 10*3/uL (ref 1.7–7.7)
NEUTROS PCT: 53 %
Platelets: 139 10*3/uL — ABNORMAL LOW (ref 150–400)
RBC: 3.56 MIL/uL — ABNORMAL LOW (ref 3.87–5.11)
RDW: 14.7 % (ref 11.5–15.5)
WBC: 7.4 10*3/uL (ref 4.0–10.5)

## 2017-07-20 LAB — COMPREHENSIVE METABOLIC PANEL
ALK PHOS: 72 U/L (ref 38–126)
ALT: 39 U/L (ref 14–54)
ANION GAP: 7 (ref 5–15)
AST: 54 U/L — AB (ref 15–41)
Albumin: 3 g/dL — ABNORMAL LOW (ref 3.5–5.0)
BILIRUBIN TOTAL: 1.4 mg/dL — AB (ref 0.3–1.2)
BUN: 20 mg/dL (ref 6–20)
CALCIUM: 8.7 mg/dL — AB (ref 8.9–10.3)
CO2: 22 mmol/L (ref 22–32)
Chloride: 108 mmol/L (ref 101–111)
Creatinine, Ser: 0.66 mg/dL (ref 0.44–1.00)
GFR calc Af Amer: >60 mL/min (ref >60)
GLUCOSE: 141 mg/dL — AB (ref 65–99)
POTASSIUM: 3.3 mmol/L — AB (ref 3.5–5.1)
Sodium: 137 mmol/L (ref 135–145)
TOTAL PROTEIN: 7 g/dL (ref 6.5–8.1)

## 2017-07-20 LAB — LIPASE, BLOOD: Lipase: 28 U/L (ref 11–51)

## 2017-07-20 MED ORDER — ONDANSETRON HCL 4 MG/2ML IJ SOLN
4.0000 mg | Freq: Once | INTRAMUSCULAR | Status: AC
  Administered 2017-07-20: 4 mg via INTRAVENOUS
  Filled 2017-07-20: qty 2

## 2017-07-20 MED ORDER — HYDROCODONE-ACETAMINOPHEN 5-325 MG PO TABS: 2.0000 | ORAL_TABLET | ORAL | 0 refills | Status: AC | PRN

## 2017-07-20 MED ORDER — HYDROMORPHONE HCL 1 MG/ML IJ SOLN
1.0000 mg | Freq: Two times a day (BID) | INTRAMUSCULAR | Status: DC | PRN
  Administered 2017-07-20: 1 mg via INTRAVENOUS
  Filled 2017-07-20: qty 1

## 2017-07-20 MED ORDER — CELECOXIB 200 MG PO CAPS: 200.0000 mg | ORAL_CAPSULE | Freq: Two times a day (BID) | ORAL | 0 refills | Status: AC

## 2017-07-20 MED ORDER — LIDOCAINE 5 % EX PTCH: 1.0000 | MEDICATED_PATCH | CUTANEOUS | 0 refills | Status: AC

## 2017-07-20 MED ORDER — METHOCARBAMOL 500 MG PO TABS: 500.0000 mg | ORAL_TABLET | Freq: Three times a day (TID) | ORAL | 0 refills | Status: AC | PRN

## 2017-07-20 MED ORDER — LIDOCAINE 5 % EX PTCH
1.0000 | MEDICATED_PATCH | CUTANEOUS | Status: DC
  Administered 2017-07-20: 1 via TRANSDERMAL
  Filled 2017-07-20 (×3): qty 1

## 2017-07-20 MED ORDER — METHOCARBAMOL 500 MG PO TABS
1000.0000 mg | ORAL_TABLET | Freq: Once | ORAL | Status: AC
Start: 2017-07-20 — End: 2017-07-20
  Administered 2017-07-20: 1000 mg via ORAL
  Filled 2017-07-20: qty 2

## 2017-07-20 NOTE — Discharge Instructions (Addendum)
Use Lidoderm patch once per day. Other medications as prescribed until follow-up with your primary care physician

## 2017-07-20 NOTE — ED Provider Notes (Signed)
[facility] EMERGENCY DEPARTMENT Provider Note   CSN: [id number] Arrival date & time: 07/20/17  1529     History   Chief Complaint Chief Complaint  Patient presents with  . Back Pain  . Emesis    yesterday "from pain"    HPI Kara Hart is a 57 y.o. female.  Complaint is back pain, vomiting  HPI : 57 year old female.  History of chronic back pain.  Seen and evaluated here 3 weeks ago after a slip and fall.  Treated with anti-inflammatories, steroids, Ultram.  States that she only had minimal relief.  Is now out of her medicine.  She states sometimes her pain will be severe.  On most days she does not take any type of medications.  However, she qualifies that stating that she used to see pain management and is no longer seeing them because she moved.  She has seen Dr. Margo Aye in town and has been referred for orthopedic evaluation.  Is not currently under pain management care.  Having pain last night became very nauseated.  This is not unusual for her.  Heme-negative nonbilious emesis.  No significant abdominal pain.  Past Medical History:  Diagnosis Date  . Anxiety   . Bipolar 1 disorder (HCC)   . Chronic back pain   . Depression   . DJD (degenerative joint disease)   . Heartburn   . Hypertension   . Panic attack   . Scoliosis     There are no active problems to display for this patient.   Past Surgical History:  Procedure Laterality Date  . APPENDECTOMY    . BACK SURGERY       OB History   None      Home Medications    Prior to Admission medications   Medication Sig Start Date End Date Taking? Authorizing Provider  diclofenac sodium (VOLTAREN) 1 % GEL Apply 2 g topically 4 (four) times daily.   Yes [provider]  diphenhydramine-acetaminophen (TYLENOL PM) 25-500 MG TABS Take 1 tablet by mouth at bedtime as needed (sleep).   Yes [provider]  gabapentin (NEURONTIN) 100 MG capsule TAKE ONE CAPSULE BY MOUTH TWICE DAILY 06/24/17  Yes  [provider]  hydrOXYzine (ATARAX/VISTARIL) 25 MG tablet Take one at night if needed for sleep Patient taking differently: Take 25 mg by mouth at bedtime.  02/04/17  Yes Bethann Berkshire, MD  omeprazole (PRILOSEC) 20 MG capsule Take 20 mg by mouth daily.   Yes [provider]  topiramate (TOPAMAX) 50 MG tablet Take 1 tablet twice a day   Yes [provider]  venlafaxine XR (EFFEXOR-XR) 150 MG 24 hr capsule Take 150 mg by mouth daily.   Yes [provider]  celecoxib (CELEBREX) 200 MG capsule Take 1 capsule (200 mg total) by mouth 2 (two) times daily. 07/20/17   Rolland Porter, MD  cyclobenzaprine (FLEXERIL) 10 MG tablet Take 1 tablet (10 mg total) by mouth 3 (three) times daily. Patient not taking: Reported on 07/20/2017 07/01/17   Ivery Quale, PA-C  dexamethasone (DECADRON) 4 MG tablet Take 1 tablet (4 mg total) by mouth 2 (two) times daily with a meal. Patient not taking: Reported on 07/20/2017 07/01/17   Ivery Quale, PA-C  diclofenac (VOLTAREN) 75 MG EC tablet Take 1 tablet (75 mg total) by mouth 2 (two) times daily. Patient not taking: Reported on 07/20/2017 07/01/17   Ivery Quale, PA-C  HYDROcodone-acetaminophen (NORCO/VICODIN) 5-325 MG tablet Take 2 tablets by mouth every 4 (four)  hours as needed. 07/20/17   Rolland Porter, MD  lidocaine (LIDODERM) 5 % Place 1 patch onto the skin daily. Remove & Discard patch within 12 hours or as directed by MD 07/20/17   Rolland Porter, MD  methocarbamol (ROBAXIN) 500 MG tablet Take 1 tablet (500 mg total) by mouth 3 (three) times daily between meals as needed. 07/20/17   Rolland Porter, MD    Family History No family history on file.  Social History Social History   Tobacco Use  . Smoking status: Current Every Day Smoker    Packs/day: 1.00    Types: Cigarettes  . Smokeless tobacco: Never Used  Substance Use Topics  . Alcohol use: No  . Drug use: No     Allergies   Pregabalin   Review of Systems Review of Systems    Constitutional: Negative for appetite change, chills, diaphoresis, fatigue and fever.  HENT: Negative for mouth sores, sore throat and trouble swallowing.   Eyes: Negative for visual disturbance.  Respiratory: Negative for cough, chest tightness, shortness of breath and wheezing.   Cardiovascular: Negative for chest pain.  Gastrointestinal: Positive for nausea and vomiting. Negative for abdominal distention, abdominal pain and diarrhea.  Endocrine: Negative for polydipsia, polyphagia and polyuria.  Genitourinary: Negative for dysuria, frequency and hematuria.  Musculoskeletal: Positive for back pain. Negative for gait problem.  Skin: Negative for color change, pallor and rash.  Neurological: Negative for dizziness, syncope, light-headedness and headaches.  Hematological: Does not bruise/bleed easily.  Psychiatric/Behavioral: Negative for behavioral problems and confusion.     Physical Exam Updated Vital Signs BP (!) 148/107 (BP Location: Right Arm)   Pulse (!) 117   Temp 98.8 F (37.1 C) (Oral)   Resp 18   Ht  (1.626 m)   Wt 83.9 kg (185 lb)   SpO2 99%   BMI 31.76 kg/m   Physical Exam  Constitutional: She is oriented to person, place, and time. She appears well-developed. No distress.  Obese 57 year old female.  Laying right side decubitus.  Mild distress secondary to her discomfort  HENT:  Head: Normocephalic.  Eyes: Pupils are equal, round, and reactive to light. Conjunctivae are normal. No scleral icterus.  Neck: Normal range of motion. Neck supple. No thyromegaly present.  Cardiovascular: Normal rate and regular rhythm. Exam reveals no gallop and no friction rub.  No murmur heard. Pulmonary/Chest: Effort normal and breath sounds normal. No respiratory distress. She has no wheezes. She has no rales.  Abdominal: Soft. Bowel sounds are normal. She exhibits no distension. There is no tenderness. There is no rebound.  Musculoskeletal: Normal range of motion.  Tenderness  in the paraspinal musculature left of midline in the low lumbar region adjacent to previous well-healed surgical scar.  Neurological: She is alert and oriented to person, place, and time.  Normal strength, reflexes, and sensation to the lateral lower extremity symmetric.  Skin: Skin is warm and dry. No rash noted.  Psychiatric: She has a normal mood and affect. Her behavior is normal.     ED Treatments / Results  Labs (all labs ordered are listed, but only abnormal results are displayed) Labs Reviewed  CBC WITH DIFFERENTIAL/PLATELET - Abnormal; Notable for the following components:      Result Value   RBC 3.56 (*)    MCV 101.1 (*)    MCH 34.3 (*)    Platelets 139 (*)    All other components within normal limits  COMPREHENSIVE METABOLIC PANEL - Abnormal; Notable for the following components:  Potassium 3.3 (*)    Glucose, Bld 141 (*)    Calcium 8.7 (*)    Albumin 3.0 (*)    AST 54 (*)    Total Bilirubin 1.4 (*)    All other components within normal limits  URINALYSIS, ROUTINE W REFLEX MICROSCOPIC - Abnormal; Notable for the following components:   Color, Urine STRAW (*)    Specific Gravity, Urine 1.002 (*)    Hgb urine dipstick SMALL (*)    All other components within normal limits  LIPASE, BLOOD    EKG None  Radiology No results found.  Procedures Procedures (including critical care time)  Medications Ordered in ED Medications  HYDROmorphone (DILAUDID) injection 1 mg (1 mg Intravenous Given 07/20/17 1630)  lidocaine (LIDODERM) 5 % 1 patch (1 patch Transdermal Patch Applied 07/20/17 1709)  methocarbamol (ROBAXIN) tablet 1,000 mg (1,000 mg Oral Given 07/20/17 1631)  ondansetron (ZOFRAN) injection 4 mg (4 mg Intravenous Given 07/20/17 1630)     Initial Impression / Assessment and Plan / ED Course  I have reviewed the triage vital signs and the nursing notes.  Pertinent labs & imaging results that were available during my care of the patient were reviewed by me and  considered in my medical decision making (see chart for details).    Nausea with back pain.  Likely secondary to pain.  Doubt kidney stone.  No lateralizing flank pain.  Abdomen is obese but soft and benign.  Plan labs, urine, medications including Dilaudid, Robaxin, Zofran.  Will reevaluate after medications, and lab testing.   6:35 PM: Reassuring labs.  Acellular urine.  No leukocytosis.  No abnormal hepatobiliary enzymes.  Pain has improved.  Plan is discharge home, Celebrex, Lidoderm, limited #8 Vicodin for pain.  Robaxin.  Primary care follow-up  Final Clinical Impressions(s) / ED Diagnoses   Final diagnoses:  Chronic low back pain without sciatica, unspecified back pain laterality    ED Discharge Orders        Ordered    HYDROcodone-acetaminophen (NORCO/VICODIN) 5-325 MG tablet  Every 4 hours PRN     07/20/17 1834    methocarbamol (ROBAXIN) 500 MG tablet  3 times daily between meals PRN     07/20/17 1834    celecoxib (CELEBREX) 200 MG capsule  2 times daily     07/20/17 1834    lidocaine (LIDODERM) 5 %  Every 24 hours     07/20/17 1834       Rolland Porter, MD 07/20/17 [phone]

## 2018-12-13 IMAGING — CT CT CERVICAL SPINE W/O CM
5 of 8 series · 14 of 33 positions shown, 15 images · non-contrast
Comparison: None.

CLINICAL DATA: Headache and neck pain after fall in yard today.

EXAM:
CT HEAD WITHOUT CONTRAST
CT CERVICAL SPINE WITHOUT CONTRAST
TECHNIQUE: Multidetector CT imaging of the head and cervical spine was
performed following the standard protocol without intravenous
contrast. Multiplanar CT image reconstructions of the cervical spine
were also generated.

[Series 4: head bone · axial · 0.41mm/px · z∈[+19,+71]mm · 2 of 80 slices shown]
[im 27/80  bone]
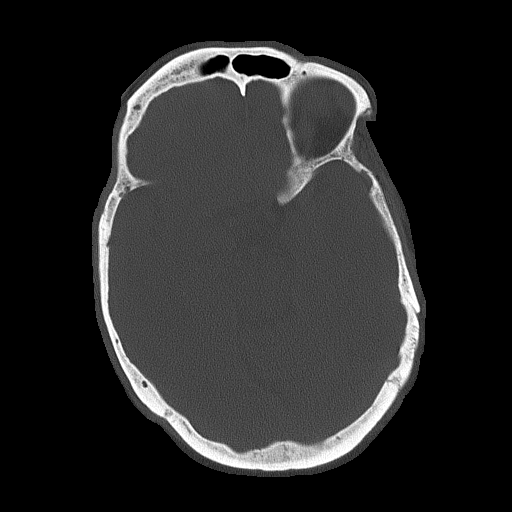
[im 53/80  bone]
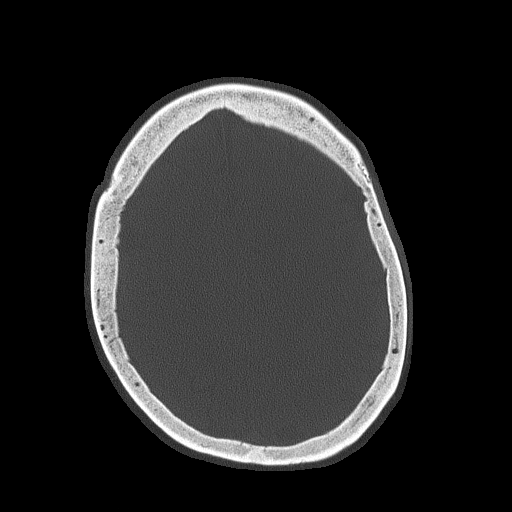

[Series 5: coronal soft tissue · coronal · 0.33mm/px · 3 of 66 slices shown]
[im 17/66  bone]
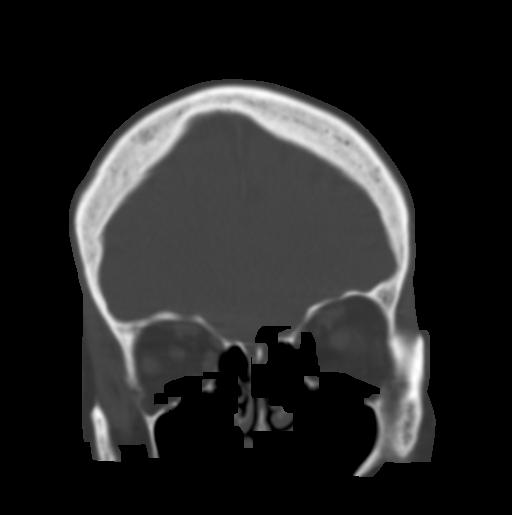
[im 33/66  bone]
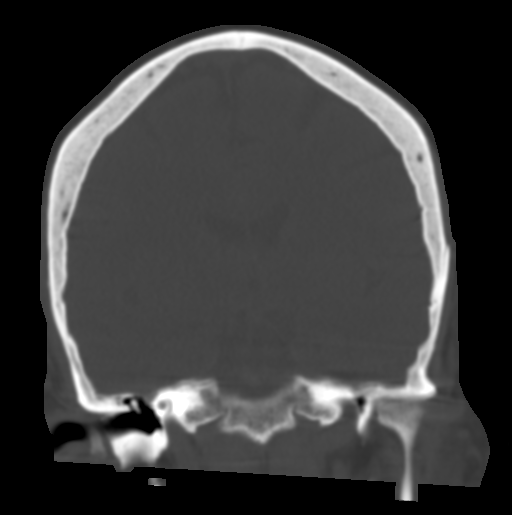
[im 49/66  bone]
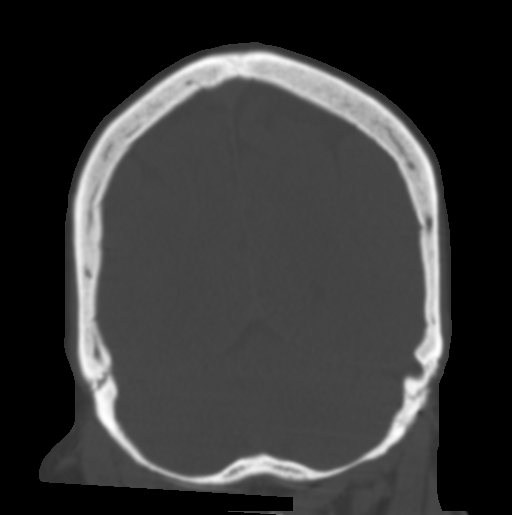

[Series 8: c spine soft · axial · 0.32mm/px · z∈[-120,-68]mm · 2 of 79 slices shown]
[im 27/79  soft-tissue]
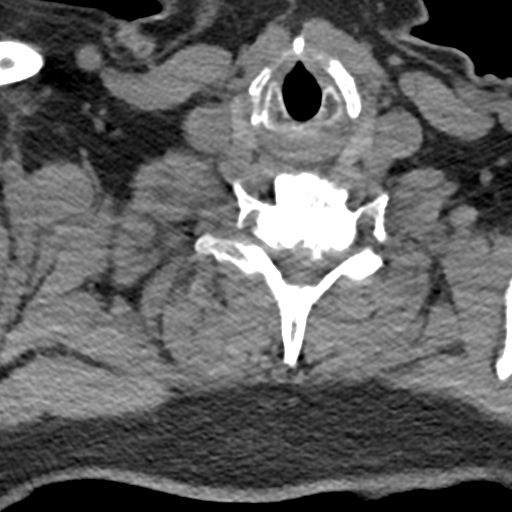
[im 53/79  soft-tissue]
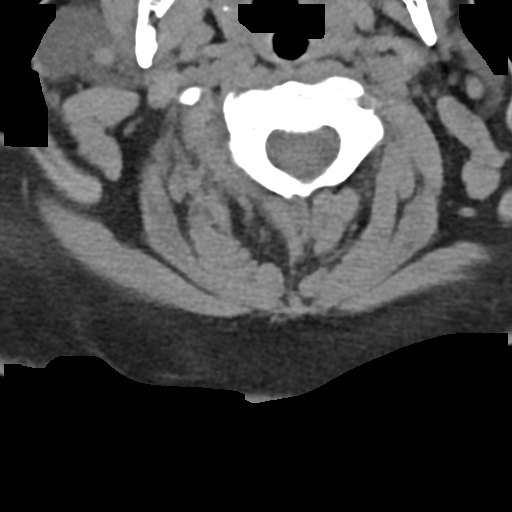

[Series 9: sagittal bone · sagittal · 0.21mm/px · 5 of 61 slices shown]
[im 11/61  bone]
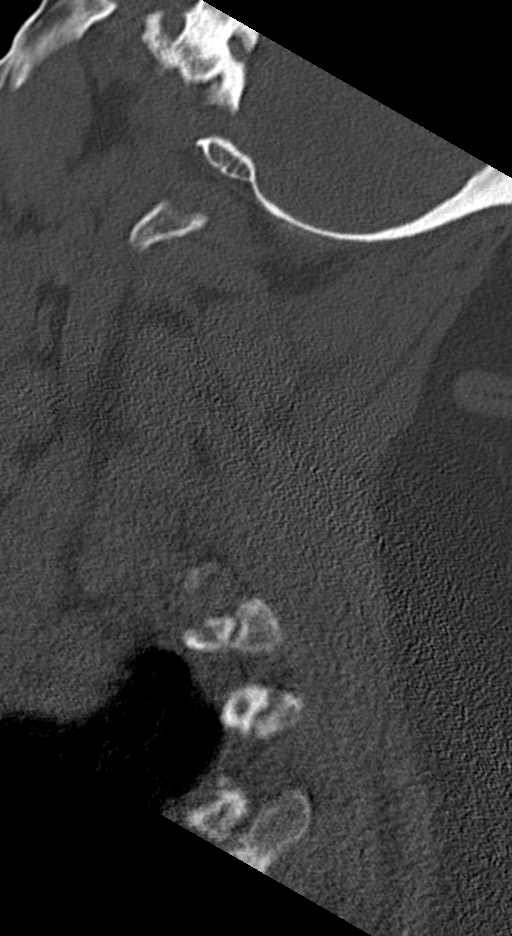
[im 21/61  bone]
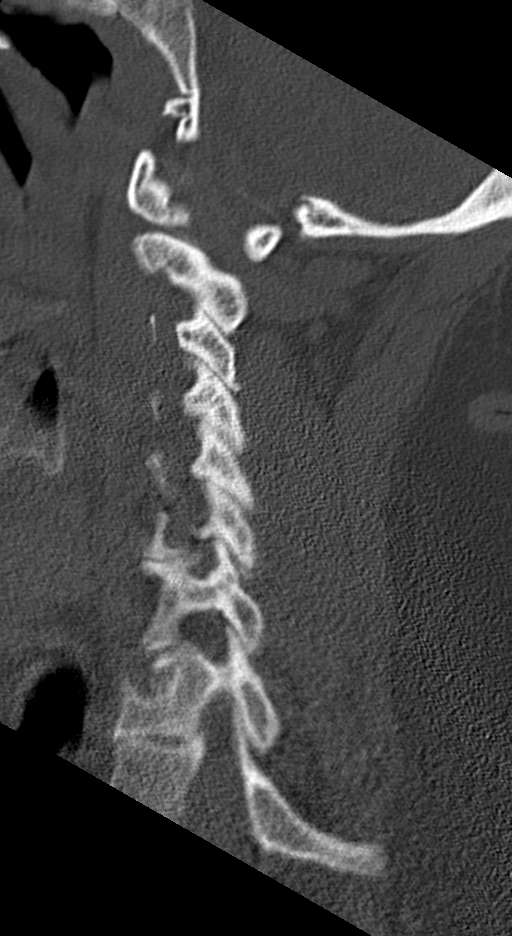
[im 31/61  bone]
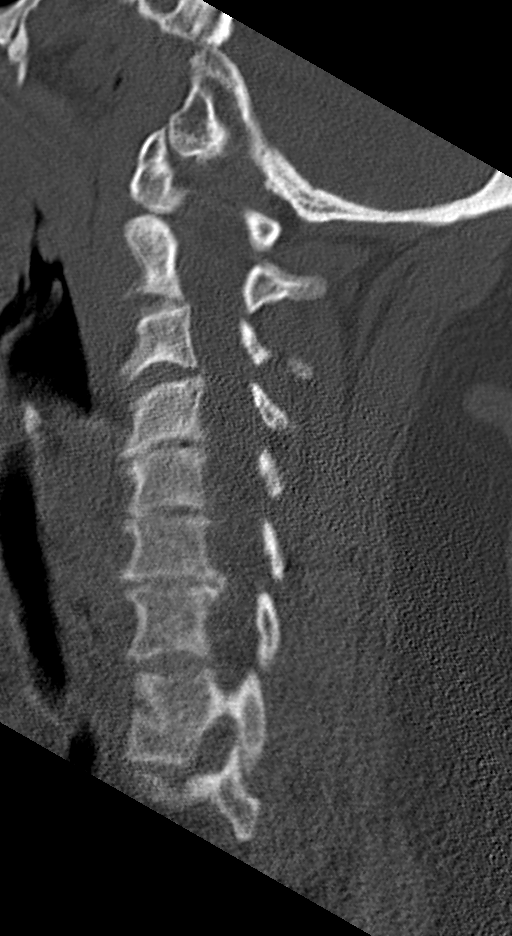
[im 41/61  bone]
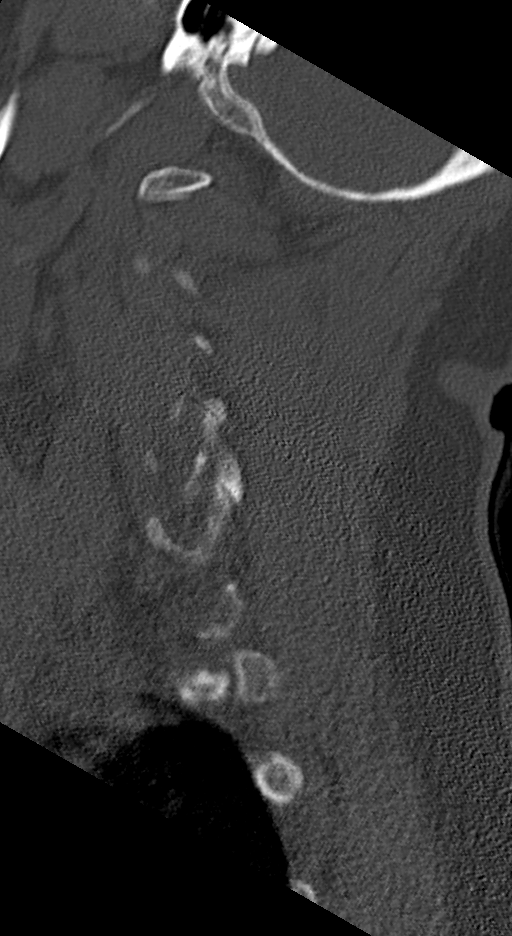
[im 51/61  bone]
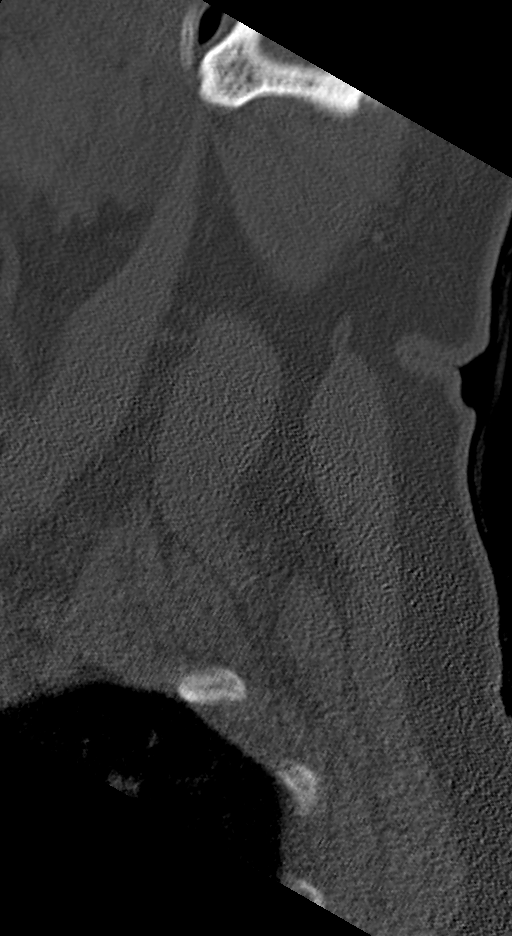

[Series 13: orthogonal bone · axial · 0.21mm/px · z∈[-134,-101]mm · 2 of 83 slices shown, 3 images]
[im 28/83  soft-tissue]
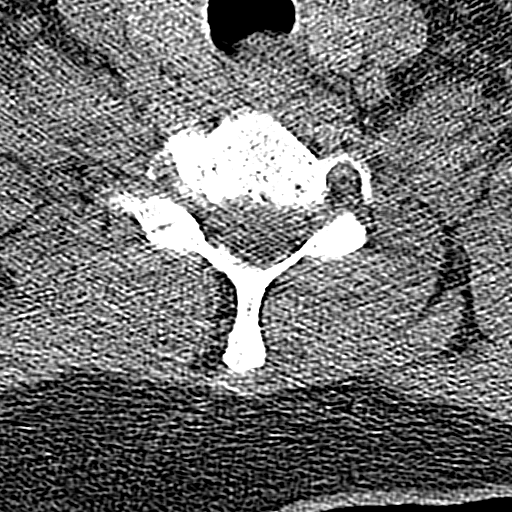
[im 28/83  bone]
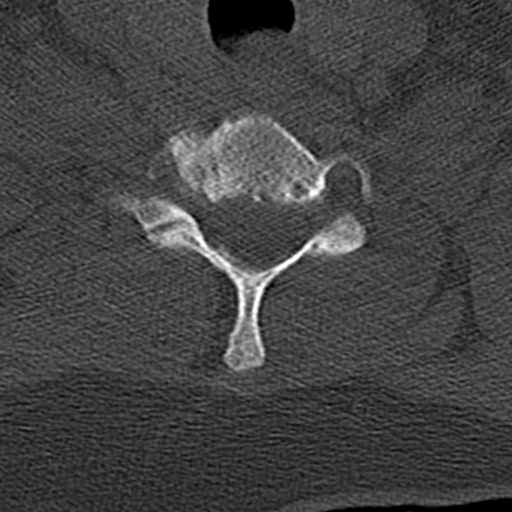
[im 55/83  bone]
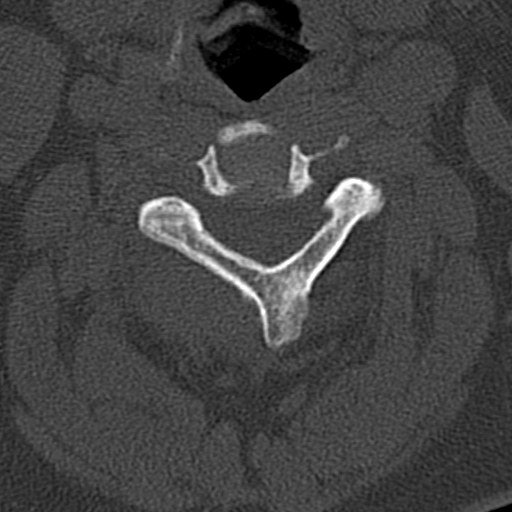

[14 of 33 positions shown; findings below may reference images not displayed]

FINDINGS: CT HEAD FINDINGS

Brain: Gray- white matter distinction is maintained. No
hydrocephalus, large vascular territory infarction, edema or midline
shift. Streak artifacts along the concavity of the skull limit
assessment. No conclusive evidence for acute intracranial
hemorrhage. Midline fourth ventricle and basal cisterns.

Vascular: No hyperdense vessel or unexpected calcification.

Skull: Negative for fracture or focal lesion.

Sinuses/Orbits: No acute finding.

Other: Mildly sclerosis and under pneumatized appearance of the left
mastoid. No significant mastoid effusion.

CT CERVICAL SPINE FINDINGS

Alignment: Intact craniocervical relationship and atlantodental
interval. Reversal of cervical lordosis attributable to multilevel
degenerative disc disease. Mild levoconvex curvature of the cervical
spine is also noted.

Skull base and vertebrae: No acute cervical spine fracture or
posttraumatic listhesis. No jumped or perched facets.

Soft tissues and spinal canal: No prevertebral fluid or swelling. No
visible canal hematoma.

Disc levels: Disc-osteophyte complexes C4 through C7 most prominent
at C6-7. Uncovertebral joint osteoarthritis with uncinate spurring
C4 through C7 bilaterally. This results in mild right-sided neural
foraminal encroachment from osteophytes from C4 through C7.

Upper chest: Negative.

Other: None
IMPRESSION: 1. Normal head CT.
2. Cervical spondylosis with degenerative disc disease from C4-5
through C6-7.
3. No acute cervical spine fracture or posttraumatic listhesis.

## 2018-12-13 IMAGING — CT CT ABD-PELV W/O CM
2 of 4 series · 16 of 46 positions shown, 18 images · non-contrast
Comparison: Lumbar spine radiographs on 11/25/2016

CLINICAL DATA: Fall with low back pain.

EXAM:
CT ABDOMEN AND PELVIS WITHOUT CONTRAST
TECHNIQUE: Multidetector CT imaging of the abdomen and pelvis was performed
following the standard protocol without IV contrast.

[Series 2: axial st · axial · 0.82mm/px · z∈[-687,-282]mm · 13 of 89 slices shown, 15 images]
[im 4/89  soft-tissue]
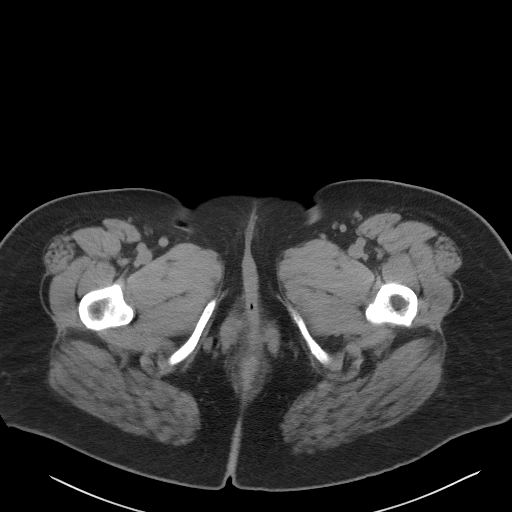
[im 4/89  bone]
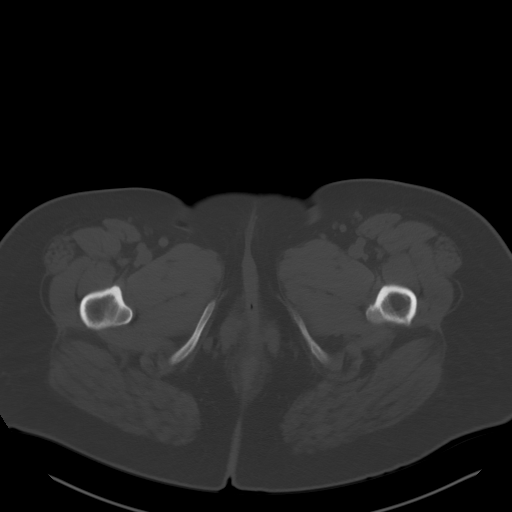
[im 12/89  soft-tissue]
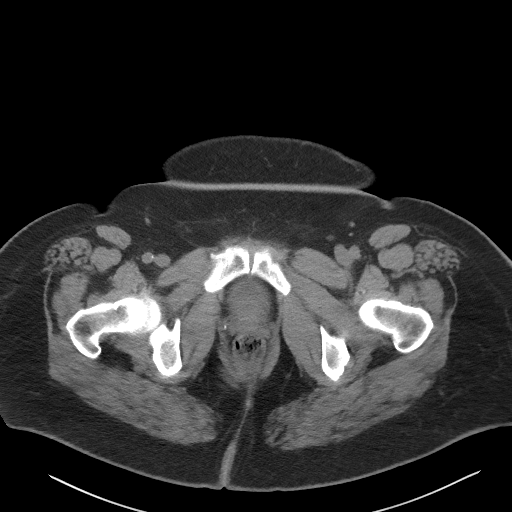
[im 19/89  soft-tissue]
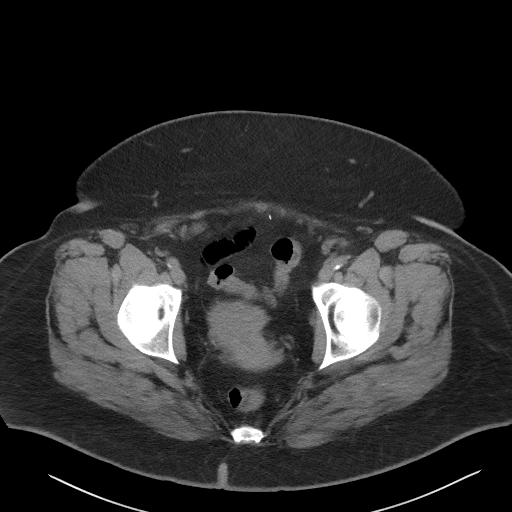
[im 26/89  soft-tissue]
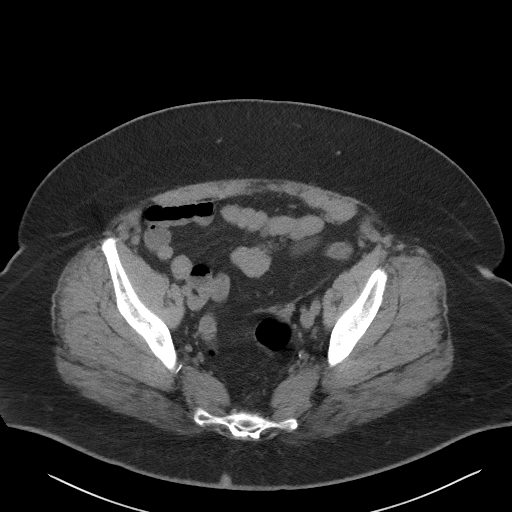
[im 30/89  soft-tissue]
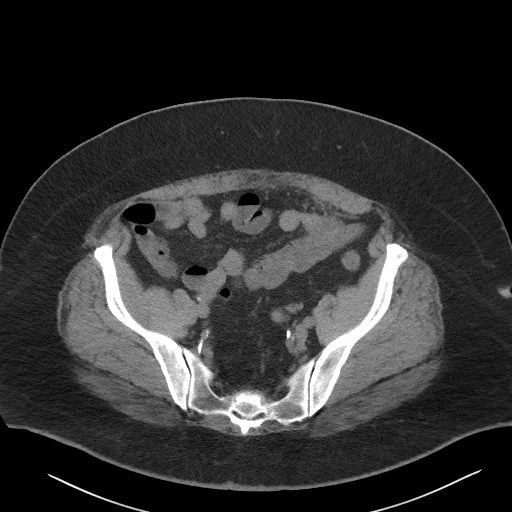
[im 37/89  soft-tissue]
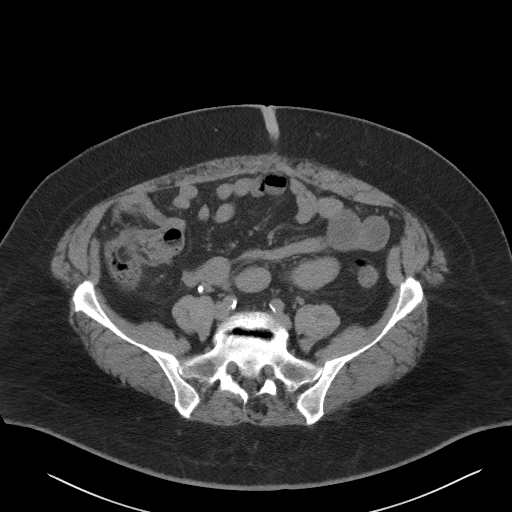
[im 45/89  soft-tissue]
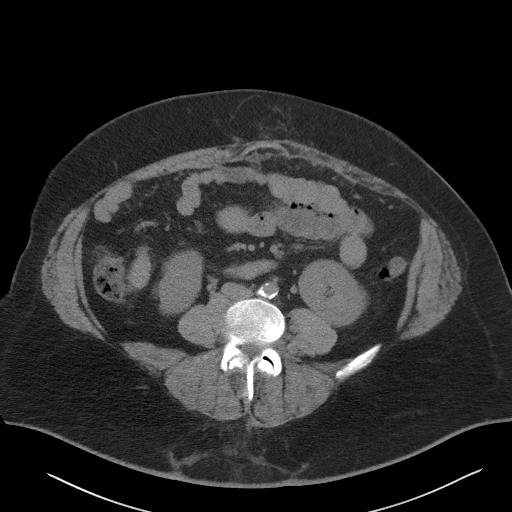
[im 52/89  soft-tissue]
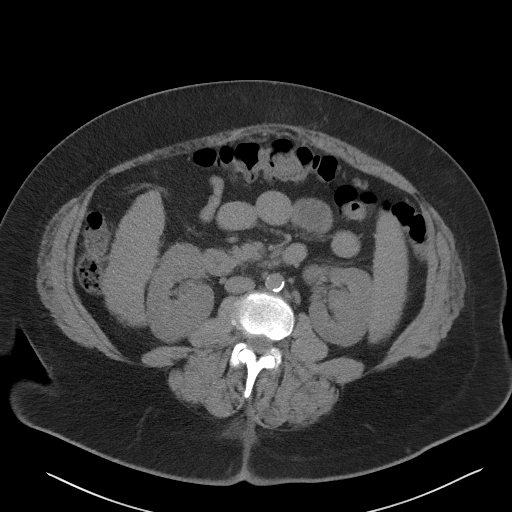
[im 59/89  soft-tissue]
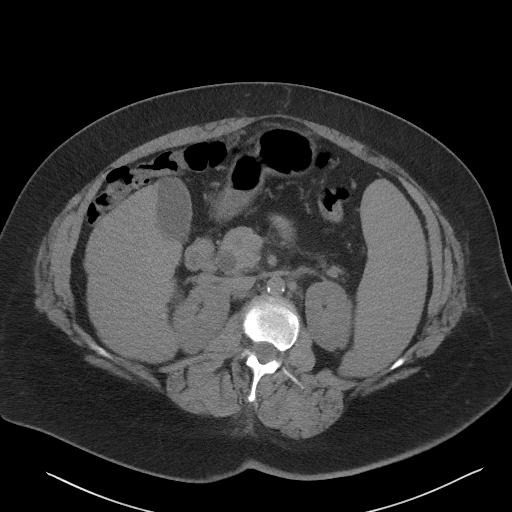
[im 59/89  bone]
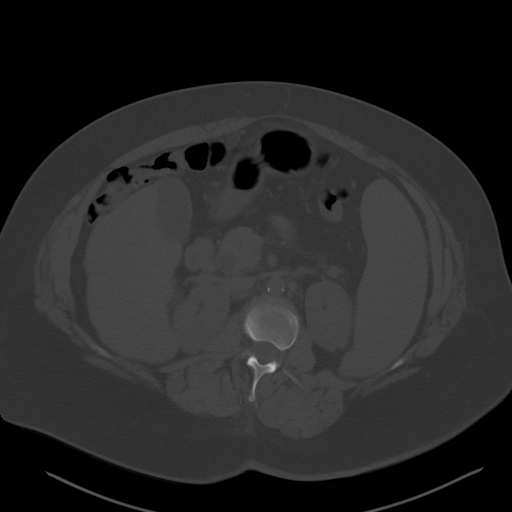
[im 63/89  soft-tissue]
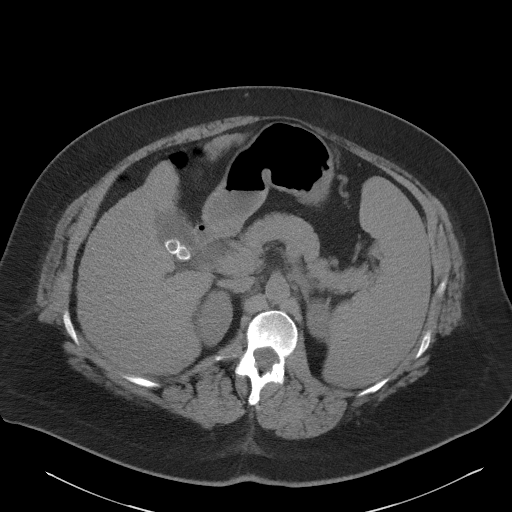
[im 70/89  soft-tissue]
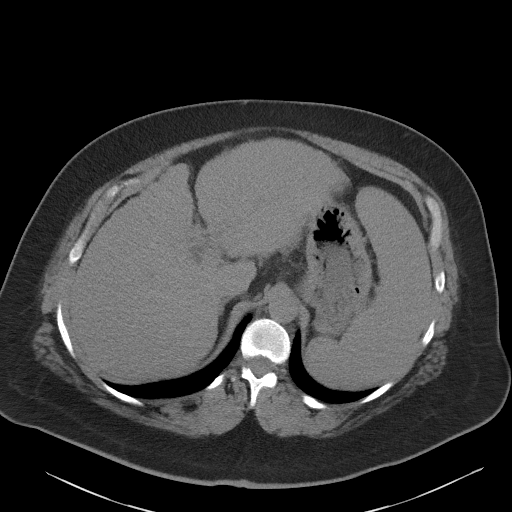
[im 78/89  soft-tissue]
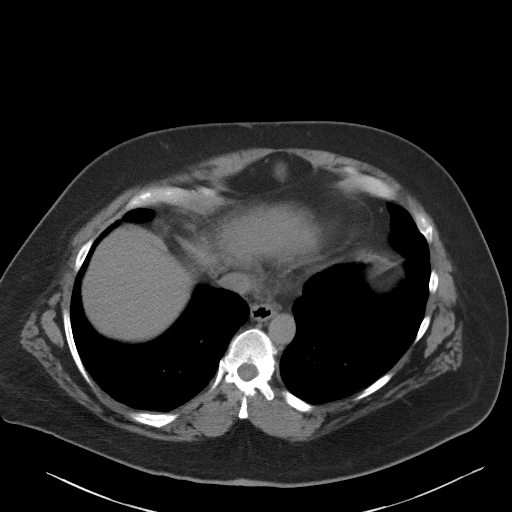
[im 85/89  soft-tissue]
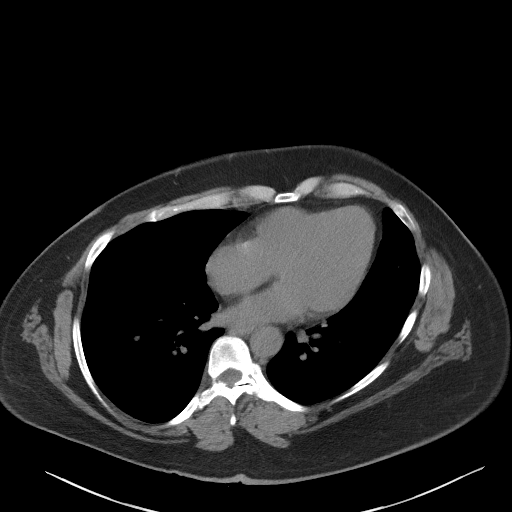

[Series 5: coronal st · coronal · 0.78mm/px · 3 of 105 slices shown]
[im 35/105  soft-tissue]
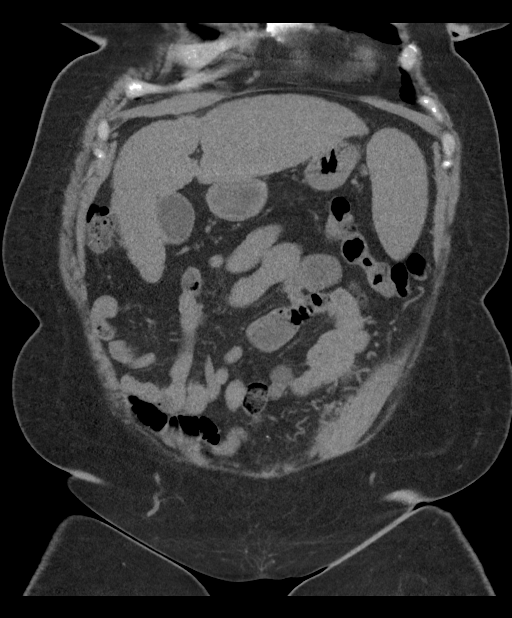
[im 47/105  soft-tissue]
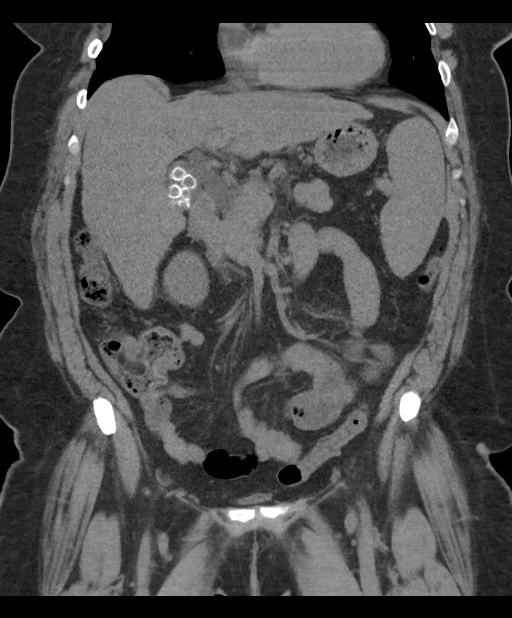
[im 58/105  soft-tissue]
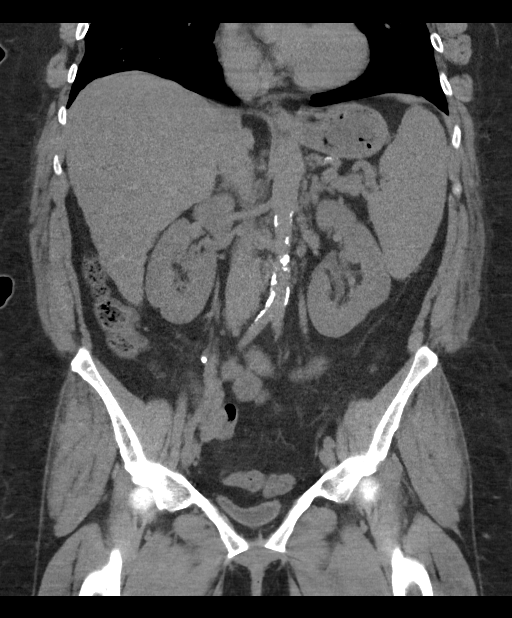

[16 of 46 positions shown; findings below may reference images not displayed]

FINDINGS: Lower chest: No acute abnormality.

Hepatobiliary: The liver shows nodular surface contour or with
enlargement of the left lobe consistent with cirrhosis. Multiple
calcified gallstones are present within the gallbladder. No evidence
of gallbladder inflammation or biliary ductal dilatation.

Pancreas: Unremarkable. No pancreatic ductal dilatation or
surrounding inflammatory changes.

Spleen: The spleen is moderately enlarged.

Adrenals/Urinary Tract: Adrenal glands appear unremarkable by
unenhanced CT. Kidneys show no evidence of hydronephrosis. There are
some punctate nonobstructing calculi within the lower pole of the
right kidney the posterior lower collecting system of the left
kidney. Ureters bladder are unremarkable.

Stomach/Bowel: Bowel shows no evidence of obstruction or
inflammation. No free air identified.

Vascular/Lymphatic: No enlarged lymph nodes identified in the
abdomen or pelvis. Some small lymph nodes in the porta hepatis and
peripancreatic region are likely reactive and associated with
cirrhosis. Calcified plaque noted in the distal aorta iliac arteries
without evidence of aneurysm.

Reproductive: Uterus and bilateral adnexa are unremarkable.

Other: No ascites. Probable small right inguinal hernia containing
fat.

Musculoskeletal: Moderate disc space narrowing at the L5-S1 level
with vacuum disc. No evidence of lumbar fracture or subluxation.
Leftward convex scoliosis present. No bony lesions.
IMPRESSION: 1. Evidence of hepatic cirrhosis. Associated splenomegaly. No
ascites.
2. Cholelithiasis with several calcified gallstones identified in
the gallbladder.
3. Tiny nonobstructing calculi in both kidneys.
4. Probable small right inguinal hernia containing fat.
5. Moderate degenerative disc disease at L5-S1. Leftward convex
scoliosis of the lumbar spine. Findings similar to lumbar x-rays on
11/25/2016. No acute lumbar fracture identified.
